# Patient Record
Sex: Female | Born: 1990 | Race: Black or African American | Hispanic: No | Marital: Single | Smoking: Current every day smoker
Health system: Southern US, Community
[De-identification: ages and names within clinical notes are randomized; demographics above are authoritative.]

## PROBLEM LIST (undated history)

## (undated) DIAGNOSIS — B999 Unspecified infectious disease: Secondary | ICD-10-CM

## (undated) DIAGNOSIS — F419 Anxiety disorder, unspecified: Secondary | ICD-10-CM

## (undated) DIAGNOSIS — R519 Headache, unspecified: Secondary | ICD-10-CM

## (undated) DIAGNOSIS — R87629 Unspecified abnormal cytological findings in specimens from vagina: Secondary | ICD-10-CM

## (undated) DIAGNOSIS — D352 Benign neoplasm of pituitary gland: Secondary | ICD-10-CM

## (undated) HISTORY — PX: NO PAST SURGERIES: SHX2092

---
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
## 2016-04-20 ENCOUNTER — Emergency Department (HOSPITAL_COMMUNITY)
Admission: EM | Admit: 2016-04-20 | Discharge: 2016-04-20 | Disposition: A | Discharge status: Home / Self Care | Payer: Medicaid Other | Attending: Emergency Medicine | Admitting: Emergency Medicine

## 2016-04-20 ENCOUNTER — Encounter (HOSPITAL_COMMUNITY): Payer: Self-pay | Admitting: Emergency Medicine

## 2016-04-20 DIAGNOSIS — F17200 Nicotine dependence, unspecified, uncomplicated: Secondary | ICD-10-CM | POA: Insufficient documentation

## 2016-04-20 DIAGNOSIS — H65191 Other acute nonsuppurative otitis media, right ear: Secondary | ICD-10-CM

## 2016-04-20 DIAGNOSIS — H9201 Otalgia, right ear: Secondary | ICD-10-CM | POA: Insufficient documentation

## 2016-04-20 DIAGNOSIS — J029 Acute pharyngitis, unspecified: Secondary | ICD-10-CM | POA: Insufficient documentation

## 2016-04-20 LAB — POC URINE PREG, ED: Preg Test, Ur: NEGATIVE

## 2016-04-20 NOTE — ED Provider Notes (Signed)
CSN: [id number]     Arrival date & time 04/20/16  0744 History   First MD Initiated Contact with Patient 04/20/16 [phone]     Chief Complaint  Patient presents with  . Otalgia    ear popping  . Sore Throat     (Consider location/radiation/quality/duration/timing/severity/associated sxs/prior Treatment) Patient is a 25 y.o. female presenting with general illness. The history is provided by the patient.  Illness Severity:  Mild Onset quality:  Gradual Duration:  1 week Timing:  Constant Progression:  Unchanged Chronicity:  New Associated symptoms: congestion and ear pain   Associated symptoms: no chest pain, no fever, no headaches, no myalgias, no nausea, no rhinorrhea, no shortness of breath, no vomiting and no wheezing     25 yo F with URI like symptoms.  Sore throat, congestion.  Denies fevers, chills.  Denies vomiting, cough.    Having some R ear popping with swallowing and chewing.   History reviewed. No pertinent past medical history. History reviewed. No pertinent past surgical history. No family history on file. Social History  Substance Use Topics  . Smoking status: Current Every Day Smoker  . Smokeless tobacco: None  . Alcohol Use: Yes   OB History    No data available     Review of Systems  Constitutional: Negative for fever and chills.  HENT: Positive for congestion and ear pain. Negative for rhinorrhea.   Eyes: Negative for redness and visual disturbance.  Respiratory: Negative for shortness of breath and wheezing.   Cardiovascular: Negative for chest pain and palpitations.  Gastrointestinal: Negative for nausea and vomiting.  Genitourinary: Negative for dysuria and urgency.  Musculoskeletal: Negative for myalgias and arthralgias.  Skin: Negative for pallor and wound.  Neurological: Negative for dizziness and headaches.      Allergies  Review of patient's allergies indicates not on file.  Home Medications   Prior to Admission medications   Not on  File   BP 110/72 mmHg  Pulse 85  Temp(Src) 97.8 F (36.6 C)  Resp 16  Ht 5\' 4"  (1.626 m)  Wt 174 lb (78.926 kg)  BMI 29.85 kg/m2  SpO2 100%  LMP 03/18/2016 Physical Exam  Constitutional: She is oriented to person, place, and time. She appears well-developed and well-nourished. No distress.  HENT:  Head: Normocephalic and atraumatic.  Swollen turbinates, posterior nasal drip, no noted sinus ttp, tm with small effusion bilaterally.  No erythema, no bulging.    Eyes: EOM are normal. Pupils are equal, round, and reactive to light.  Neck: Normal range of motion. Neck supple.  Cardiovascular: Normal rate and regular rhythm.  Exam reveals no gallop and no friction rub.   No murmur heard. Pulmonary/Chest: Effort normal. She has no wheezes. She has no rales.  Abdominal: Soft. She exhibits no distension. There is no tenderness.  Musculoskeletal: She exhibits no edema or tenderness.  Neurological: She is alert and oriented to person, place, and time.  Skin: Skin is warm and dry. She is not diaphoretic.  Psychiatric: She has a normal mood and affect. Her behavior is normal.  Nursing note and vitals reviewed.   ED Course  Procedures (including critical care time) Labs Review Labs Reviewed - No data to display  Imaging Review No results found. I have personally reviewed and evaluated these images and lab results as part of my medical decision-making.   EKG Interpretation None      MDM   Final diagnoses:  Acute middle ear effusion, right    25  yo F with uri like symptoms. No signs of otitis, posterior nasal drip.   8:05 AM:  I have discussed the diagnosis/risks/treatment options with the patient and believe the pt to be eligible for discharge home to follow-up with PCP. We also discussed returning to the ED immediately if new or worsening sx occur. We discussed the sx which are most concerning (e.g., sudden worsening pain, fever, inability to tolerate by mouth) that necessitate  immediate return. Medications administered to the patient during their visit and any new prescriptions provided to the patient are listed below.  Medications given during this visit Medications - No data to display  New Prescriptions   No medications on file    The patient appears reasonably screen and/or stabilized for discharge and I doubt any other medical condition or other [facility] requiring further screening, evaluation, or treatment in the ED at this time prior to discharge.       Deno Etienne, DO 04/20/16 [phone]

## 2016-04-20 NOTE — Discharge Instructions (Signed)
Try afrin and pseudofed.  Follow up with your family doc. If you do not have one call the hotline number provided.

## 2016-04-20 NOTE — ED Notes (Signed)
Pt. Stated, My ears have been popping since yesterday.

## 2016-04-26 ENCOUNTER — Encounter (HOSPITAL_COMMUNITY): Payer: Self-pay | Admitting: Emergency Medicine

## 2016-04-26 ENCOUNTER — Ambulatory Visit (HOSPITAL_COMMUNITY)
Admission: EM | Admit: 2016-04-26 | Discharge: 2016-04-26 | Disposition: A | Discharge status: Home / Self Care | Payer: Self-pay | Attending: Emergency Medicine | Admitting: Emergency Medicine

## 2016-04-26 DIAGNOSIS — H6500 Acute serous otitis media, unspecified ear: Secondary | ICD-10-CM

## 2016-04-26 DIAGNOSIS — H9201 Otalgia, right ear: Secondary | ICD-10-CM

## 2016-04-26 MED ORDER — NEOMYCIN-POLYMYXIN-HC 3.5-10000-1 OT SUSP: 4.0000 [drp] | Freq: Three times a day (TID) | OTIC | Status: DC

## 2016-04-26 MED ORDER — AMOXICILLIN 875 MG PO TABS: 875.0000 mg | ORAL_TABLET | Freq: Two times a day (BID) | ORAL | Status: DC

## 2016-04-26 NOTE — Discharge Instructions (Signed)
Ear Drops, Adult You have been diagnosed with a condition requiring you to put drops of medicine into your outer ear. HOME CARE INSTRUCTIONS   Put drops in the affected ear as instructed. After putting the drops in, you will need to lie down with the affected ear facing up for ten minutes so the drops will remain in the ear canal and run down and fill the canal. Continue using the ear drops for as long as directed by your health care provider.  Prior to getting up, put a cotton ball gently in your ear canal. Leave enough of the cotton ball out so it can be easily removed. Do not attempt to push this down into the canal with a cotton-tipped swab or other instrument.  Do not irrigate or wash out your ears if you have had a perforated eardrum or mastoid surgery, or unless instructed to do so by your health care provider.  Keep appointments with your health care provider as instructed.  Finish all medicine, or use for the length of time prescribed by your health care provider. Continue the drops even if your problem seems to be doing well after a couple days, or continue as instructed. SEEK MEDICAL CARE IF:  You become worse or develop increasing pain.  You notice any unusual drainage from your ear (particularly if the drainage has a bad smell).  You develop hearing difficulties.  You experience a serious form of dizziness in which you feel as if the room is spinning, and you feel nauseated (vertigo).  The outside of your ear becomes red or swollen or both. This may be a sign of an allergic reaction. MAKE SURE YOU:   Understand these instructions.  Will watch your condition.  Will get help right away if you are not doing well or get worse.   This information is not intended to replace advice given to you by your health care provider. Make sure you discuss any questions you have with your health care provider.   Document Released: 09/28/2001 Document Revised: 10/25/2014 Document  Reviewed: 05/01/2013 Elsevier Interactive Patient Education [employer].

## 2016-04-26 NOTE — ED Notes (Signed)
The patient presented to the [facility] with a complaint of a right ear ache that has been going on for 2 weeks. The patient was evaluated at the [facility] on 04/20/2016 and was advised to use OTC meds but she stated that she has not had the money to purchase those and her ear is hurting worse now.

## 2016-04-26 NOTE — ED Provider Notes (Signed)
CSN: [id number]     Arrival date & time 04/26/16  1605 History   First MD Initiated Contact with Patient 04/26/16 1650     Chief Complaint  Patient presents with  . Otalgia   (Consider location/radiation/quality/duration/timing/severity/associated sxs/prior Treatment) Patient is a 25 y.o. female presenting with ear pain. The history is provided by the patient.  Otalgia Location:  Right Behind ear:  No abnormality Quality:  Aching and dull Severity:  Mild Onset quality:  Gradual Duration:  2 weeks Timing:  Constant Progression:  Worsening Chronicity:  New Relieved by:  Nothing Worsened by:  Nothing tried Ineffective treatments:  None tried   History reviewed. No pertinent past medical history. History reviewed. No pertinent past surgical history. History reviewed. No pertinent family history. Social History  Substance Use Topics  . Smoking status: Current Every Day Smoker  . Smokeless tobacco: None  . Alcohol Use: Yes   OB History    No data available     Review of Systems  Constitutional: Negative.   HENT: Positive for ear pain.   Eyes: Negative.   Respiratory: Negative.   Cardiovascular: Negative.   Gastrointestinal: Negative.   Endocrine: Negative.   Genitourinary: Negative.   Musculoskeletal: Negative.   Skin: Negative.   Allergic/Immunologic: Negative.   Neurological: Negative.   Hematological: Negative.   Psychiatric/Behavioral: Negative.     Allergies  Review of patient's allergies indicates no known allergies.  Home Medications   Prior to Admission medications   Medication Sig Start Date End Date Taking? Authorizing Provider  bromocriptine (PARLODEL) 5 MG capsule Take 5 mg by mouth daily.   Yes Historical Provider, MD   Meds Ordered and Administered this Visit  Medications - No data to display  BP 118/69 mmHg  Pulse 91  Temp(Src) 98.6 F (37 C) (Oral)  Resp 18  SpO2 100%  LMP 04/04/2016 (Exact Date) No data found.   Physical Exam   Constitutional: She appears well-developed and well-nourished.  HENT:  Head: Normocephalic.  Left Ear: External ear normal.  Mouth/Throat: Oropharynx is clear and moist.  Right TM injected and dull  Eyes: Conjunctivae and EOM are normal. Pupils are equal, round, and reactive to light.  Neck: Normal range of motion. Neck supple.  Cardiovascular: Normal rate, regular rhythm and normal heart sounds.   Pulmonary/Chest: Effort normal and breath sounds normal.  Abdominal: Soft. Bowel sounds are normal.    ED Course  Procedures (including critical care time)  Labs Review Labs Reviewed - No data to display  Imaging Review No results found.   Visual Acuity Review  Right Eye Distance:   Left Eye Distance:   Bilateral Distance:    Right Eye Near:   Left Eye Near:    Bilateral Near:         MDM  Right otalgia Right otitis media  Amoxicillin 875mg  one po bid x 7 days #14 Cortisporin otic ear drops 4 gtt's right ear x 7 d #10 ml Sudafed otc prn      Lysbeth Penner, FNP 04/26/16 1711

## 2016-05-01 ENCOUNTER — Encounter (HOSPITAL_COMMUNITY): Payer: Self-pay

## 2016-05-01 ENCOUNTER — Emergency Department (HOSPITAL_COMMUNITY)
Admission: EM | Admit: 2016-05-01 | Discharge: 2016-05-01 | Disposition: A | Discharge status: Home / Self Care | Payer: Medicaid Other | Attending: Emergency Medicine | Admitting: Emergency Medicine

## 2016-05-01 DIAGNOSIS — F17200 Nicotine dependence, unspecified, uncomplicated: Secondary | ICD-10-CM | POA: Diagnosis not present

## 2016-05-01 DIAGNOSIS — H9201 Otalgia, right ear: Secondary | ICD-10-CM | POA: Diagnosis present

## 2016-05-01 DIAGNOSIS — H65191 Other acute nonsuppurative otitis media, right ear: Secondary | ICD-10-CM | POA: Diagnosis not present

## 2016-05-01 DIAGNOSIS — H6591 Unspecified nonsuppurative otitis media, right ear: Secondary | ICD-10-CM

## 2016-05-01 NOTE — Discharge Instructions (Signed)
Take your prescription of amoxicillin 875 mg twice daily for the next 7 days. I also recommend continuing to take a decongestant such as Sudafed as prescribed over-the-counter. Please follow up with a primary care provider from the Resource Guide provided below in 5-7 days if your symptoms have not improved. Please return to the Emergency Department if symptoms worsen or new onset of fever, headache, neck stiffness, facial/ear swelling, your drainage, loss of hearing, tenderness behind her right ear.

## 2016-05-01 NOTE — ED Notes (Signed)
Pt on cell phone during assessment. Female in room w/pt and small child.

## 2016-05-01 NOTE — ED Notes (Signed)
Patient here with right ear pain and decreased hearing x 3 weeks, states her ear feels full.

## 2016-05-01 NOTE — ED Provider Notes (Signed)
CSN: [id number]     Arrival date & time 05/01/16  1826 History  By signing my name below, I, Patricia Morales, attest that this documentation has been prepared under the direction and in the presence of non-physician practitioner, Harlene Ramus, PA-C. Electronically Signed: [name], Scribe. 05/01/2016. 7:09 PM.   Chief Complaint  Patient presents with  . Otalgia   The history is provided by the patient. No language interpreter was used.   HPI Comments: Patricia Morales is a 25 y.o. female who presents to the Emergency Department complaining of unchanged, right ear pressure and pain that began 3 weeks ago. Pt reports associated "fullness" in her ear, muffled hearing, and sinus pressure. Pt notes this is the third time she has visited the ED for similar symptoms. Pt states she was prescribed antibiotics at her last visit but was unable to fill her prescription because she could not afford it. She denies drainage from her left ear, nasal congestion, cough, fever, sore throat, and facial swelling. Pt denies hx of DM.    Chart review shows pt visited the ED on 7/4 in which she was diagnosed with a URI and treated with over the counter symptomatic management. Pt then visited [facility] on 7/10 in which she was diagnosed with otitis media and prescribed sudafed, amoxacillin, and cortisporin.   History reviewed. No pertinent past medical history. History reviewed. No pertinent past surgical history. No family history on file. Social History  Substance Use Topics  . Smoking status: Current Every Day Smoker  . Smokeless tobacco: None  . Alcohol Use: Yes   OB History    No data available     Review of Systems  Constitutional: Negative for fever.  HENT: Positive for congestion, ear pain and hearing loss. Negative for ear discharge, facial swelling and sore throat.   Respiratory: Negative for cough.    Allergies  Review of patient's allergies indicates no known allergies.  Home Medications   Prior to  Admission medications   Medication Sig Start Date End Date Taking? Authorizing Provider  amoxicillin (AMOXIL) 875 MG tablet Take 1 tablet (875 mg total) by mouth 2 (two) times daily. 04/26/16   Lysbeth Penner, FNP  bromocriptine (PARLODEL) 5 MG capsule Take 5 mg by mouth daily.    Historical Provider, MD  neomycin-polymyxin-hydrocortisone (CORTISPORIN) 3.5-10000-1 otic suspension Place 4 drops into the right ear 3 (three) times daily. 04/26/16   Lysbeth Penner, FNP   BP 108/69 mmHg  Pulse 95  Temp(Src) 98.3 F (36.8 C) (Oral)  Resp 18  SpO2 99%  LMP 04/04/2016 (Exact Date) Physical Exam  Constitutional: She is oriented to person, place, and time. She appears well-developed and well-nourished.  HENT:  Head: Normocephalic and atraumatic.  Right Ear: Hearing, external ear and ear canal normal. No drainage, swelling or tenderness. No mastoid tenderness. Tympanic membrane is injected. Tympanic membrane is not bulging. A middle ear effusion is present.  Left Ear: Hearing, external ear and ear canal normal. A middle ear effusion is present.  Nose: Nose normal.  Mouth/Throat: Uvula is midline, oropharynx is clear and moist and mucous membranes are normal. No oropharyngeal exudate, posterior oropharyngeal edema, posterior oropharyngeal erythema or tonsillar abscesses.  Eyes: Conjunctivae and EOM are normal. Right eye exhibits no discharge. Left eye exhibits no discharge. No scleral icterus.  Neck: Normal range of motion. Neck supple.  Cardiovascular: Normal rate.   Pulmonary/Chest: Effort normal.  Lymphadenopathy:    She has no cervical adenopathy.  Neurological: She is alert  and oriented to person, place, and time.  Nursing note and vitals reviewed.   ED Course  Procedures  DIAGNOSTIC STUDIES:  Oxygen Saturation is 99% on RA, normal by my interpretation.    COORDINATION OF CARE:  7:07 PM Discussed treatment Morales with pt at bedside and pt agreed to Morales.  Labs Review Labs Reviewed  - No data to display  Imaging Review No results found. I have personally reviewed and evaluated these images and lab results as part of my medical decision-making.   EKG Interpretation None      MDM   Final diagnoses:  Otitis media with effusion, right    Patient presents with otalgia and exam consistent with acute otitis media with bilateral effusion. No concern for acute mastoiditis, meningitis. No antibiotic use in the last month, patient denies feeling her prescription of amoxicillin which was given to her at her previous visit at urgent care 5 days ago.  Patient reports she can now afford her prescription and is planning on picking up her prescription this evening at [facility]. Advised patient to also continue taking over-the-counter decongestion as prescribed. Advised patient to follow up with PCP as needed. Discussed return precautions with patient.   I personally performed the services described in this documentation, which was scribed in my presence. The recorded information has been reviewed and is accurate.    Patricia Morales, [place] 05/01/16 1912  Patricia Ferguson, MD 05/01/16 [phone]

## 2016-05-01 NOTE — ED Notes (Signed)
PA at bedside.

## 2016-06-15 ENCOUNTER — Encounter (HOSPITAL_COMMUNITY): Payer: Self-pay | Admitting: *Deleted

## 2016-06-15 ENCOUNTER — Emergency Department (HOSPITAL_COMMUNITY)
Admission: EM | Admit: 2016-06-15 | Discharge: 2016-06-15 | Disposition: A | Discharge status: Home / Self Care | Payer: Medicaid Other | Attending: Emergency Medicine | Admitting: Emergency Medicine

## 2016-06-15 DIAGNOSIS — F17200 Nicotine dependence, unspecified, uncomplicated: Secondary | ICD-10-CM | POA: Insufficient documentation

## 2016-06-15 DIAGNOSIS — Z5321 Procedure and treatment not carried out due to patient leaving prior to being seen by health care provider: Secondary | ICD-10-CM | POA: Diagnosis not present

## 2016-06-15 DIAGNOSIS — R1010 Upper abdominal pain, unspecified: Secondary | ICD-10-CM | POA: Diagnosis not present

## 2016-06-15 HISTORY — DX: Benign neoplasm of pituitary gland: D35.2

## 2016-06-15 LAB — CBC
HCT: 32.6 % — ABNORMAL LOW (ref 36.0–46.0)
Hemoglobin: 11.1 g/dL — ABNORMAL LOW (ref 12.0–15.0)
MCH: 29.7 pg (ref 26.0–34.0)
MCHC: 34 g/dL (ref 30.0–36.0)
MCV: 87.2 fL (ref 78.0–100.0)
PLATELETS: 232 10*3/uL (ref 150–400)
RBC: 3.74 MIL/uL — ABNORMAL LOW (ref 3.87–5.11)
RDW: 12.7 % (ref 11.5–15.5)
WBC: 5.7 10*3/uL (ref 4.0–10.5)

## 2016-06-15 LAB — COMPREHENSIVE METABOLIC PANEL
ALT: 12 U/L — AB (ref 14–54)
AST: 15 U/L (ref 15–41)
Albumin: 3.9 g/dL (ref 3.5–5.0)
Alkaline Phosphatase: 49 U/L (ref 38–126)
Anion gap: 6 (ref 5–15)
BILIRUBIN TOTAL: 0.2 mg/dL — AB (ref 0.3–1.2)
BUN: 8 mg/dL (ref 6–20)
CO2: 25 mmol/L (ref 22–32)
CREATININE: 0.65 mg/dL (ref 0.44–1.00)
Calcium: 9.5 mg/dL (ref 8.9–10.3)
Chloride: 104 mmol/L (ref 101–111)
GFR calc Af Amer: >60 mL/min (ref >60)
Glucose, Bld: 95 mg/dL (ref 65–99)
POTASSIUM: 3.5 mmol/L (ref 3.5–5.1)
Sodium: 135 mmol/L (ref 135–145)
TOTAL PROTEIN: 7.3 g/dL (ref 6.5–8.1)

## 2016-06-15 LAB — I-STAT BETA HCG BLOOD, ED (MC, WL, AP ONLY)

## 2016-06-15 LAB — LIPASE, BLOOD: Lipase: 32 U/L (ref 11–51)

## 2016-06-15 NOTE — ED Notes (Signed)
Pt had to take her child to restroom.  This is the delay in getting back to triage.

## 2016-06-15 NOTE — ED Notes (Signed)
Pt came to desk advising she was leaving.  EMT Jeneen Rinks questioned why and she stated because her child needed to eat.  I advised he I would go get her child something to eat if she would stay.  Once I got ready to get up to go get her daughter a sandwich she stated " nevermind I am going to leave."  She walked out with family.

## 2016-06-15 NOTE — ED Triage Notes (Signed)
Pt states upper abdominal pain for 4 days and then rectal discomfort like she needs to poop.  Pt states she had a BM this am.  Last menstrual early July and thinks she is having pregnancy symptoms

## 2016-06-16 ENCOUNTER — Encounter (HOSPITAL_COMMUNITY): Payer: Self-pay | Admitting: Emergency Medicine

## 2016-06-16 ENCOUNTER — Emergency Department (HOSPITAL_COMMUNITY)
Admission: EM | Admit: 2016-06-16 | Discharge: 2016-06-17 | Disposition: A | Discharge status: Home / Self Care | Payer: Medicaid Other | Source: Home / Self Care | Attending: Emergency Medicine | Admitting: Emergency Medicine

## 2016-06-16 ENCOUNTER — Emergency Department (HOSPITAL_COMMUNITY): Payer: Medicaid Other

## 2016-06-16 DIAGNOSIS — M791 Myalgia: Secondary | ICD-10-CM | POA: Diagnosis not present

## 2016-06-16 DIAGNOSIS — F17210 Nicotine dependence, cigarettes, uncomplicated: Secondary | ICD-10-CM | POA: Diagnosis not present

## 2016-06-16 DIAGNOSIS — N390 Urinary tract infection, site not specified: Secondary | ICD-10-CM

## 2016-06-16 DIAGNOSIS — R1010 Upper abdominal pain, unspecified: Secondary | ICD-10-CM

## 2016-06-16 DIAGNOSIS — R109 Unspecified abdominal pain: Secondary | ICD-10-CM | POA: Diagnosis present

## 2016-06-16 DIAGNOSIS — R1011 Right upper quadrant pain: Secondary | ICD-10-CM | POA: Diagnosis not present

## 2016-06-16 DIAGNOSIS — F17200 Nicotine dependence, unspecified, uncomplicated: Secondary | ICD-10-CM | POA: Insufficient documentation

## 2016-06-16 LAB — COMPREHENSIVE METABOLIC PANEL
ALBUMIN: 4.1 g/dL (ref 3.5–5.0)
ALT: 12 U/L — ABNORMAL LOW (ref 14–54)
ANION GAP: 8 (ref 5–15)
AST: 17 U/L (ref 15–41)
Alkaline Phosphatase: 52 U/L (ref 38–126)
BILIRUBIN TOTAL: 0.4 mg/dL (ref 0.3–1.2)
BUN: 9 mg/dL (ref 6–20)
CO2: 25 mmol/L (ref 22–32)
Calcium: 9.8 mg/dL (ref 8.9–10.3)
Chloride: 103 mmol/L (ref 101–111)
Creatinine, Ser: 0.62 mg/dL (ref 0.44–1.00)
GFR calc Af Amer: >60 mL/min (ref >60)
Glucose, Bld: 108 mg/dL — ABNORMAL HIGH (ref 65–99)
POTASSIUM: 3.6 mmol/L (ref 3.5–5.1)
Sodium: 136 mmol/L (ref 135–145)
TOTAL PROTEIN: 7.3 g/dL (ref 6.5–8.1)

## 2016-06-16 LAB — URINALYSIS, ROUTINE W REFLEX MICROSCOPIC
Bilirubin Urine: NEGATIVE
Glucose, UA: NEGATIVE mg/dL
Ketones, ur: NEGATIVE mg/dL
Nitrite: NEGATIVE
Protein, ur: NEGATIVE mg/dL
Specific Gravity, Urine: 1.02 (ref 1.005–1.030)
pH: 6 (ref 5.0–8.0)

## 2016-06-16 LAB — CBC
HEMATOCRIT: 31.7 % — AB (ref 36.0–46.0)
HEMOGLOBIN: 11 g/dL — AB (ref 12.0–15.0)
MCH: 30.2 pg (ref 26.0–34.0)
MCHC: 34.7 g/dL (ref 30.0–36.0)
MCV: 87.1 fL (ref 78.0–100.0)
Platelets: 220 10*3/uL (ref 150–400)
RBC: 3.64 MIL/uL — AB (ref 3.87–5.11)
RDW: 12.6 % (ref 11.5–15.5)
WBC: 5.7 10*3/uL (ref 4.0–10.5)

## 2016-06-16 LAB — URINE MICROSCOPIC-ADD ON

## 2016-06-16 LAB — LIPASE, BLOOD: Lipase: 28 U/L (ref 11–51)

## 2016-06-16 LAB — POC URINE PREG, ED: Preg Test, Ur: NEGATIVE

## 2016-06-16 MED ORDER — FOSFOMYCIN TROMETHAMINE 3 G PO PACK
3.0000 g | PACK | Freq: Once | ORAL | Status: AC
  Administered 2016-06-17: 3 g via ORAL
  Filled 2016-06-16: qty 3

## 2016-06-16 MED ORDER — ACETAMINOPHEN 325 MG PO TABS
650.0000 mg | ORAL_TABLET | Freq: Once | ORAL | Status: AC
  Administered 2016-06-17: 650 mg via ORAL
  Filled 2016-06-16: qty 2

## 2016-06-16 NOTE — ED Triage Notes (Signed)
Abdominal pain for 4-5 days. Feels tight and constipated. Had small BMs of only small, hard balls of stool. Feels nauseated and is also concerned she may be pregnant (has not taken a pregnancy test at home).

## 2016-06-16 NOTE — Discharge Instructions (Signed)
As discussed, your evaluation today has been largely reassuring.  But, it is important that you monitor your condition carefully, and do not hesitate to return to the ED if you develop new, or concerning changes in your condition. ? ?Otherwise, please follow-up with your physician for appropriate ongoing care. ? ?

## 2016-06-16 NOTE — ED Provider Notes (Signed)
[facility] DEPT Provider Note   CSN: [id number] Arrival date & time: 06/16/16  2032     History   Chief Complaint Chief Complaint  Patient presents with  . Abdominal Pain    HPI Patricia Morales is a 25 y.o. female.  HPI Patient presents with concern of abdominal pain. Symptoms of been present for at least 4 days, with migratory abdominal pain, currently on the right lateral abdomen, though yesterday it was in the midline upper abdomen. Mild associated nausea, and some pain in the area with duration. No vomiting, no diarrhea, and the patient actually acknowledges constipation. Patient recently moved to this area, has no primary care physician. Last menstrual period slightly more than one month ago. 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 Patient also takes no medication for hormone balance. Since onset of this pain episode, no medication taken for pain relief. Patient also denies other new changes, including fevers, chills, urinary complaints, Past Medical History:  Diagnosis Date  . Prolactinoma ([place])     There are no active problems to display for this patient.   History reviewed. No pertinent surgical history.  OB History    No data available       Home Medications    Prior to Admission medications   Not on File    Family History History reviewed. No pertinent family history.  Social History Social History  Substance Use Topics  . Smoking status: Current Every Day Smoker    Packs/day: 0.50  . Smokeless tobacco: Never Used  . Alcohol use Yes     Allergies   Review of patient's allergies indicates no known allergies.   Review of Systems Review of Systems  Constitutional:       Per HPI, otherwise negative  HENT:       Per HPI, otherwise negative  Respiratory:       Per HPI, otherwise negative  Cardiovascular:       Per HPI, otherwise negative  Gastrointestinal: Negative for vomiting.  Endocrine:       Negative aside  from HPI  Genitourinary:       Neg aside from HPI   Musculoskeletal:       Per HPI, otherwise negative  Skin: Negative.   Neurological: Negative for syncope.     Physical Exam Updated Vital Signs BP 103/68 (BP Location: Left Arm)   Pulse 80   Temp 98.5 F (36.9 C) (Oral)   Resp 19   LMP 04/19/2016 (Within Months) Comment: double-shielded patient/ pt. states she is not preg.  SpO2 99%   Physical Exam  Constitutional: She is oriented to person, place, and time. She appears well-developed and well-nourished. No distress.  HENT:  Head: Normocephalic and atraumatic.  Eyes: Conjunctivae and EOM are normal.  Cardiovascular: Normal rate and regular rhythm.   Pulmonary/Chest: Effort normal and breath sounds normal. No stridor. No respiratory distress.  Abdominal: She exhibits no distension.  Minimal discomfort with palpation about the right lateral abdominal wall, negative Murphy,  Musculoskeletal: She exhibits no edema.  Neurological: She is alert and oriented to person, place, and time. No cranial nerve deficit.  Skin: Skin is warm and dry.  Psychiatric: She has a normal mood and affect.  Nursing note and vitals reviewed.    ED Treatments / Results  Labs (all labs ordered are listed, but only abnormal results are displayed) Labs Reviewed  COMPREHENSIVE METABOLIC PANEL - Abnormal; Notable for the following:       Result Value  Glucose, Bld 108 (*)    ALT 12 (*)    All other components within normal limits  CBC - Abnormal; Notable for the following:    RBC 3.64 (*)    Hemoglobin 11.0 (*)    HCT 31.7 (*)    All other components within normal limits  URINALYSIS, ROUTINE W REFLEX MICROSCOPIC (NOT AT [facility]) - Abnormal; Notable for the following:    APPearance CLOUDY (*)    Hgb urine dipstick MODERATE (*)    Leukocytes, UA SMALL (*)    All other components within normal limits  URINE MICROSCOPIC-ADD ON - Abnormal; Notable for the following:    Squamous Epithelial / LPF 6-30  (*)    Bacteria, UA MANY (*)    All other components within normal limits  URINE CULTURE  LIPASE, BLOOD  POC URINE PREG, ED     Radiology Dg Chest 2 View  Result Date: 06/16/2016 CLINICAL DATA:  Dyspnea with inspiration EXAM: CHEST  2 VIEW COMPARISON:  None. FINDINGS: Cardiomediastinal contours are normal. No pneumothorax or sizable pleural effusion. No focal airspace consolidation or pulmonary edema. IMPRESSION: Clear lungs. Electronically Signed   By: Ulyses Jarred M.D.   On: 06/16/2016 22:02    Procedures Procedures (including critical care time)  Medications Ordered in ED Medications  fosfomycin (MONUROL) packet 3 g (not administered)  acetaminophen (TYLENOL) tablet 650 mg (not administered)     Initial Impression / Assessment and Plan / ED Course  I have reviewed the triage vital signs and the nursing notes.  Pertinent labs & imaging results that were available during my care of the patient were reviewed by me and considered in my medical decision making (see chart for details). In female presents with several days of abdominal pain. Here the patient is awake and alert, with soft, minimally tender abdomen. No evidence for peritonitis, bacteremia, sepsis. There is some evidence for possible urinary tract infection, and the patient had urine culture sent after initial antibiotics provided. Patient provided outpatient follow-up resources.   Carmin Muskrat, MD 06/16/16 2312

## 2016-06-17 ENCOUNTER — Inpatient Hospital Stay (HOSPITAL_COMMUNITY): Payer: Medicaid Other

## 2016-06-17 ENCOUNTER — Inpatient Hospital Stay (HOSPITAL_COMMUNITY)
Admission: AD | Admit: 2016-06-17 | Discharge: 2016-06-17 | Disposition: A | Discharge status: Home / Self Care | Payer: Medicaid Other | Source: Ambulatory Visit | Attending: Obstetrics & Gynecology | Admitting: Obstetrics & Gynecology

## 2016-06-17 DIAGNOSIS — R109 Unspecified abdominal pain: Secondary | ICD-10-CM | POA: Insufficient documentation

## 2016-06-17 DIAGNOSIS — M791 Myalgia: Secondary | ICD-10-CM

## 2016-06-17 DIAGNOSIS — M7918 Myalgia, other site: Secondary | ICD-10-CM

## 2016-06-17 DIAGNOSIS — F17210 Nicotine dependence, cigarettes, uncomplicated: Secondary | ICD-10-CM | POA: Insufficient documentation

## 2016-06-17 DIAGNOSIS — R1011 Right upper quadrant pain: Secondary | ICD-10-CM

## 2016-06-17 LAB — URINE MICROSCOPIC-ADD ON

## 2016-06-17 LAB — URINALYSIS, ROUTINE W REFLEX MICROSCOPIC
Bilirubin Urine: NEGATIVE
Glucose, UA: NEGATIVE mg/dL
KETONES UR: NEGATIVE mg/dL
LEUKOCYTES UA: NEGATIVE
NITRITE: NEGATIVE
PROTEIN: NEGATIVE mg/dL
Specific Gravity, Urine: 1.015 (ref 1.005–1.030)
pH: 7 (ref 5.0–8.0)

## 2016-06-17 MED ORDER — IBUPROFEN 800 MG PO TABS: 800.0000 mg | ORAL_TABLET | Freq: Three times a day (TID) | ORAL | 3 refills | Status: DC | PRN

## 2016-06-17 MED ORDER — CYCLOBENZAPRINE HCL 10 MG PO TABS: 10.0000 mg | ORAL_TABLET | Freq: Three times a day (TID) | ORAL | 2 refills | Status: DC | PRN

## 2016-06-17 NOTE — MAU Provider Note (Signed)
History     CSN: [id number]  Arrival date and time: 06/17/16 1748   None     Chief Complaint  Patient presents with  . Abdominal Pain   25 y.o. F here with report of right sided upper abdominal pain for the past week. This is her third visit to ER; she left AMA before evaluation on 06/15/16, underwent extensive evaluation on 06/16/16 and the only diagnosis was of a possible UTI.  She denies she has a UTI, is concerned about the pain on the top-right corner of her abdomen. She is able to eat, had a pork chop dinner a few hours ago. She does endorse doing some heavy lifting, feels she may have gotten a muscle spasm but she is worried about having gallstones. No exacerbation when eating, no other associated symptoms.   The history is provided by the patient.  Abdominal Pain  The primary symptoms of the illness include abdominal pain and vaginal bleeding. The primary symptoms of the illness do not include fever, fatigue, nausea, vomiting or vaginal discharge. The problem has not changed since onset. The abdominal pain is located in the RUQ. The abdominal pain does not radiate.  The patient states that she believes she is currently not pregnant. The patient has not had a change in bowel habit. Symptoms associated with the illness do not include chills, anorexia, heartburn, urgency, frequency or back pain.    Past Medical History:  Diagnosis Date  . Prolactinoma ([place])     No past surgical history on file.  No family history on file.  Social History  Substance Use Topics  . Smoking status: Current Every Day Smoker    Packs/day: 0.50  . Smokeless tobacco: Never Used  . Alcohol use Yes    Allergies: No Known Allergies  No prescriptions prior to admission.    Review of Systems  Constitutional: Negative for chills, fatigue and fever.  Gastrointestinal: Positive for abdominal pain. Negative for anorexia, heartburn, nausea and vomiting.  Genitourinary: Positive for vaginal bleeding.  Negative for frequency, urgency and vaginal discharge.  Musculoskeletal: Negative for back pain.   Physical Exam   Blood pressure 121/65, pulse 86, temperature 98.7 F (37.1 C), temperature source Oral, resp. rate 18, last menstrual period 04/19/2016.  Physical Exam  Constitutional: She is oriented to person, place, and time. She appears well-developed and well-nourished.  HENT:  Head: Normocephalic and atraumatic.  Cardiovascular: Normal rate.   Respiratory: Effort normal.  GI: Soft. Bowel sounds are normal. She exhibits no distension. There is tenderness. There is no rebound and no guarding.  RUQ/R flank tenderness to palpation. Negative Murphy's sign.  Musculoskeletal: Normal range of motion.  Neurological: She is alert and oriented to person, place, and time.  Skin: Skin is warm and dry.  Psychiatric: She has a normal mood and affect.    MAU Course  Procedures  MDM RUQ Ultrasound was ordered; was suboptimal given patient's fasting status (ate 2 hours before study). No gall stones were seen  Results for orders placed or performed during the hospital encounter of 06/17/16 (from the past 72 hour(s))  Urinalysis, Routine w reflex microscopic (not at [facility])     Status: Abnormal   Collection Time: 06/17/16  6:57 PM  Result Value Ref Range   Color, Urine YELLOW YELLOW   APPearance CLEAR CLEAR   Specific Gravity, Urine 1.015 1.005 - 1.030   pH 7.0 5.0 - 8.0   Glucose, UA NEGATIVE NEGATIVE mg/dL   Hgb urine dipstick SMALL (A)  NEGATIVE   Bilirubin Urine NEGATIVE NEGATIVE   Ketones, ur NEGATIVE NEGATIVE mg/dL   Protein, ur NEGATIVE NEGATIVE mg/dL   Nitrite NEGATIVE NEGATIVE   Leukocytes, UA NEGATIVE NEGATIVE  Urine microscopic-add on     Status: Abnormal   Collection Time: 06/17/16  6:57 PM  Result Value Ref Range   Squamous Epithelial / LPF 0-5 (A) NONE SEEN   WBC, UA 0-5 0 - 5 WBC/hpf   RBC / HPF 0-5 0 - 5 RBC/hpf   Bacteria, UA FEW (A) NONE SEEN   CMP Latest Ref Rng &  Units 06/16/2016 06/15/2016  Glucose 65 - 99 mg/dL 108(H) 95  BUN 6 - 20 mg/dL 9 8  Creatinine 0.44 - 1.00 mg/dL 0.62 0.65  Sodium 135 - 145 mmol/L 136 135  Potassium 3.5 - 5.1 mmol/L 3.6 3.5  Chloride 101 - 111 mmol/L 103 104  CO2 22 - 32 mmol/L 25 25  Calcium 8.9 - 10.3 mg/dL 9.8 9.5  Total Protein 6.5 - 8.1 g/dL 7.3 7.3  Total Bilirubin 0.3 - 1.2 mg/dL 0.4 0.2(L)  Alkaline Phos 38 - 126 U/L 52 49  AST 15 - 41 U/L 17 15  ALT 14 - 54 U/L 12(L) 12(L)     Assessment and Plan  RUQ/R flank pain. Negative RUQ U/S, negative CMET. Likely musculoskeletal. Flexeril and Ibuprofen prescribed Advised to return for worsening symptoms.  Osborne Oman, MD 06/17/2016, 8:31 PM

## 2016-06-17 NOTE — MAU Note (Signed)
Pt seen in ER yesterday, dx'd with UTI, but pt states she doesn't have a UTI.  C/O sharp pain in R side, radiates into upper abdomen for the past week.  Denies dysuria.  Denies bleeding or discharge.

## 2016-06-17 NOTE — Discharge Instructions (Signed)
[place] for [employer] at [facility]      Phone: [phone]  [facility] for [employer] at [place] Phone: [place] for [employer] at [place]  Phone: [place] for [place] at [facility]  Phone: [place] for [place] at [place]  Phone: [place] [facility]       Phone: [phone]   [place]   Phone: [phone]  [facility]      Phone: [phone]      Flank Pain Flank pain refers to pain that is located on the side of the body between the upper abdomen and the back. The pain may occur over a short period of time (acute) or may be long-term or reoccurring (chronic). It may be mild or severe. Flank pain can be caused by many things. CAUSES  Some of the more common causes of flank pain include:  Muscle strains.   Muscle spasms.   A disease of your spine (vertebral disk disease).   A lung infection (pneumonia).   Fluid around your lungs (pulmonary edema).   A kidney infection.   Kidney stones.   A very painful skin rash caused by the chickenpox virus (shingles).   Gallbladder disease.  [place] care will depend on the cause of your pain. In general,  Rest as directed by your caregiver.  Drink enough fluids to keep your urine clear or pale yellow.  Only take over-the-counter or prescription medicines as directed by your caregiver. Some medicines may help relieve the pain.  Tell your caregiver about any changes in your pain.  Follow up with your caregiver as directed. SEEK IMMEDIATE MEDICAL CARE IF:   Your pain is not controlled with medicine.   You have new or worsening symptoms.  Your pain increases.   You have abdominal pain.   You have shortness of breath.   You have persistent nausea or vomiting.   You  have swelling in your abdomen.   You feel faint or pass out.   You have blood in your urine.  You have a fever or persistent symptoms for more than 2-3 days.  You have a fever and your symptoms suddenly get worse. MAKE SURE YOU:   Understand these instructions.  Will watch your condition.  Will get help right away if you are not doing well or get worse.   This information is not intended to replace advice given to you by your health care provider. Make sure you discuss any questions you have with your health care provider.   Document Released: 11/25/2005 Document Revised: 06/28/2012 Document Reviewed: 05/18/2012 Elsevier Interactive Patient Education [employer].

## 2016-06-18 LAB — URINE CULTURE

## 2016-07-13 ENCOUNTER — Ambulatory Visit: Payer: Self-pay | Admitting: Family Medicine

## 2017-01-17 ENCOUNTER — Encounter (HOSPITAL_COMMUNITY): Payer: Self-pay

## 2017-01-17 ENCOUNTER — Emergency Department (HOSPITAL_COMMUNITY)
Admission: EM | Admit: 2017-01-17 | Discharge: 2017-01-17 | Disposition: A | Discharge status: Home / Self Care | Payer: Medicaid Other | Attending: Emergency Medicine | Admitting: Emergency Medicine

## 2017-01-17 DIAGNOSIS — N939 Abnormal uterine and vaginal bleeding, unspecified: Secondary | ICD-10-CM | POA: Insufficient documentation

## 2017-01-17 DIAGNOSIS — D352 Benign neoplasm of pituitary gland: Secondary | ICD-10-CM

## 2017-01-17 DIAGNOSIS — Z79899 Other long term (current) drug therapy: Secondary | ICD-10-CM | POA: Insufficient documentation

## 2017-01-17 DIAGNOSIS — F17200 Nicotine dependence, unspecified, uncomplicated: Secondary | ICD-10-CM | POA: Insufficient documentation

## 2017-01-17 LAB — I-STAT BETA HCG BLOOD, ED (MC, WL, AP ONLY)

## 2017-01-17 NOTE — Discharge Instructions (Signed)
You are given resources for primary care providers who can give you referral to endocrinology. Please also try endocrinology through [facility] regarding follow-up.

## 2017-01-17 NOTE — ED Provider Notes (Signed)
[place] DEPT Provider Note    By signing my name below, I, Bea Graff, attest that this documentation has been prepared under the direction and in the presence of Forde Dandy, MD. Electronically Signed: Bea Graff, ED Scribe. 01/17/17. 6:38 PM.    History   Chief Complaint Chief Complaint  Patient presents with  . Vaginal Bleeding    The history is provided by the patient and medical records. No language interpreter was used.    Patricia Morales is a 26 y.o. female who presents to the Emergency Department complaining of vaginal bleeding she noticed after she wiped after urinating this morning. She states she has not been taking her Bromocriptine prescribed by her endocrinologist in [place]  For prolactinoma because it makes her feel sick. Pt states she is currently wearing a pad and has not seen any blood on it at all. She has not done anything to treat her symptoms. There are no modifying factors noted. She denies vaginal discharge, abdominal pain, or any other complaints. She does not currently have a PCP and has tried to establish care with an endocrinologist at [facility] in [facility]. She states she is having difficulty having her medical records transferred.    Past Medical History:  Diagnosis Date  . Prolactinoma ([place])     There are no active problems to display for this patient.   History reviewed. No pertinent surgical history.  OB History    No data available       Home Medications    Prior to Admission medications   Medication Sig Start Date End Date Taking? Authorizing Provider  cyclobenzaprine (FLEXERIL) 10 MG tablet Take 1 tablet (10 mg total) by mouth 3 (three) times daily as needed for muscle spasms. 06/17/16   Osborne Oman, MD  ibuprofen (ADVIL,MOTRIN) 800 MG tablet Take 1 tablet (800 mg total) by mouth 3 (three) times daily with meals as needed for headache or moderate pain. 06/17/16   Osborne Oman, MD    Family  History History reviewed. No pertinent family history.  Social History Social History  Substance Use Topics  . Smoking status: Current Every Day Smoker    Packs/day: 0.50  . Smokeless tobacco: Never Used  . Alcohol use Yes     Allergies   Patient has no known allergies.   Review of Systems Review of Systems 10/14 systems reviewed and are negative other than those stated in the HPI   Physical Exam Updated Vital Signs BP 100/67 (BP Location: Left Arm)   Pulse 89   Temp 98.9 F (37.2 C) (Oral)   Resp 19   SpO2 97%   Physical Exam Physical Exam  Nursing note and vitals reviewed. Constitutional: Well developed, well nourished, non-toxic, and in no acute distress Head: Normocephalic and atraumatic.  Mouth/Throat: Oropharynx is clear and moist.  Neck: Normal range of motion. Neck supple.  Cardiovascular: Normal rate and regular rhythm.   Pulmonary/Chest: Effort normal and breath sounds normal.  Abdominal: Soft. There is no tenderness. There is no rebound and no guarding.  Musculoskeletal: Normal range of motion.  Neurological: Alert, no facial droop, fluent speech, moves all extremities symmetrically Skin: Skin is warm and dry.  Psychiatric: Cooperative   ED Treatments / Results  DIAGNOSTIC STUDIES: Oxygen Saturation is 97% on RA, normal by my interpretation.   COORDINATION OF CARE: 6:36 PM- Will give resources for pt to establish care with a PCP. Pt verbalizes understanding and agrees to plan.  Medications - No data  to display  Labs (all labs ordered are listed, but only abnormal results are displayed) Labs Reviewed  I-STAT BETA HCG BLOOD, ED ([facility], [facility], [facility] ONLY)    EKG  EKG Interpretation None       Radiology No results found.  Procedures Procedures (including critical care time)  Medications Ordered in ED Medications - No data to display   Initial Impression / Assessment and Plan / ED Course  I have reviewed the triage vital signs and the  nursing notes.  Pertinent labs & imaging results that were available during my care of the patient were reviewed by me and considered in my medical decision making (see chart for details).     History of the prolactinoma, with episode of vaginal spotting today in the setting of not taking her bromocriptine. She has been amenorrheic for the past 2 months. This is likely because of her prolactinoma and not taking her medications. She is trying to reestablish care with endocrinology here, and given resources for primary care provider as well as endocrinology also that she can restart these medications. States that her vaginal spotting is now gone. Has otherwise benign exam. No concerns for STD/PID or other intrapelvic processes.   The patient appears reasonably screened and/or stabilized for discharge and I doubt any other medical condition or other [facility] requiring further screening, evaluation, or treatment in the ED at this time prior to discharge.  Strict return and follow-up instructions reviewed. She expressed understanding of all discharge instructions and felt comfortable with the plan of care.   Final Clinical Impressions(s) / ED Diagnoses   Final diagnoses:  Vaginal spotting  Prolactinoma ([place])    New Prescriptions New Prescriptions   No medications on file   I personally performed the services described in this documentation, which was scribed in my presence. The recorded information has been reviewed and is accurate.      Forde Dandy, MD 01/17/17 [phone]

## 2017-01-17 NOTE — ED Triage Notes (Signed)
Pt states she has not had a period in 60 days. This morning she noticed bright red blood. Pt states this was abnormal for her.

## 2017-06-01 ENCOUNTER — Emergency Department (HOSPITAL_COMMUNITY)
Admission: EM | Admit: 2017-06-01 | Discharge: 2017-06-01 | Disposition: A | Discharge status: Home / Self Care | Payer: Medicaid Other | Attending: Emergency Medicine | Admitting: Emergency Medicine

## 2017-06-01 ENCOUNTER — Encounter (HOSPITAL_COMMUNITY): Payer: Self-pay

## 2017-06-01 DIAGNOSIS — R51 Headache: Secondary | ICD-10-CM | POA: Insufficient documentation

## 2017-06-01 DIAGNOSIS — Z5321 Procedure and treatment not carried out due to patient leaving prior to being seen by health care provider: Secondary | ICD-10-CM | POA: Insufficient documentation

## 2017-06-01 LAB — URINALYSIS, ROUTINE W REFLEX MICROSCOPIC
Bilirubin Urine: NEGATIVE
GLUCOSE, UA: NEGATIVE mg/dL
Ketones, ur: NEGATIVE mg/dL
LEUKOCYTES UA: NEGATIVE
Nitrite: NEGATIVE
PH: 8 (ref 5.0–8.0)
Protein, ur: NEGATIVE mg/dL
RBC / HPF: NONE SEEN RBC/hpf (ref 0–5)
SPECIFIC GRAVITY, URINE: 1.008 (ref 1.005–1.030)

## 2017-06-01 LAB — COMPREHENSIVE METABOLIC PANEL
ALT: 14 U/L (ref 14–54)
AST: 18 U/L (ref 15–41)
Albumin: 4 g/dL (ref 3.5–5.0)
Alkaline Phosphatase: 42 U/L (ref 38–126)
Anion gap: 9 (ref 5–15)
BUN: 5 mg/dL — AB (ref 6–20)
CHLORIDE: 103 mmol/L (ref 101–111)
CO2: 27 mmol/L (ref 22–32)
CREATININE: 0.66 mg/dL (ref 0.44–1.00)
Calcium: 10 mg/dL (ref 8.9–10.3)
GFR calc Af Amer: >60 mL/min (ref >60)
Glucose, Bld: 102 mg/dL — ABNORMAL HIGH (ref 65–99)
Potassium: 3.5 mmol/L (ref 3.5–5.1)
Sodium: 139 mmol/L (ref 135–145)
Total Bilirubin: 0.6 mg/dL (ref 0.3–1.2)
Total Protein: 7.6 g/dL (ref 6.5–8.1)

## 2017-06-01 LAB — CBC
HCT: 33 % — ABNORMAL LOW (ref 36.0–46.0)
Hemoglobin: 11.1 g/dL — ABNORMAL LOW (ref 12.0–15.0)
MCH: 28.6 pg (ref 26.0–34.0)
MCHC: 33.6 g/dL (ref 30.0–36.0)
MCV: 85.1 fL (ref 78.0–100.0)
PLATELETS: 252 10*3/uL (ref 150–400)
RBC: 3.88 MIL/uL (ref 3.87–5.11)
RDW: 13.8 % (ref 11.5–15.5)
WBC: 5.9 10*3/uL (ref 4.0–10.5)

## 2017-06-01 LAB — I-STAT BETA HCG BLOOD, ED (MC, WL, AP ONLY): I-stat hCG, quantitative: <5 m[IU]/mL (ref <5)

## 2017-06-01 LAB — LIPASE, BLOOD: LIPASE: 27 U/L (ref 11–51)

## 2017-06-01 NOTE — ED Triage Notes (Signed)
Pt reports lack of period for four months, sts that it is related to her prolactin being elecated reports breast leakage and abd cramps, sts she does not think it is pregnancy related, pt sts headache also related to her tumor. Prolactinoma hx.

## 2017-06-01 NOTE — ED Notes (Signed)
Called for room X2, no answer

## 2017-07-05 ENCOUNTER — Encounter (HOSPITAL_COMMUNITY): Payer: Self-pay | Admitting: *Deleted

## 2017-07-05 ENCOUNTER — Inpatient Hospital Stay (HOSPITAL_COMMUNITY)
Admission: AD | Admit: 2017-07-05 | Discharge: 2017-07-05 | Disposition: A | Discharge status: Home / Self Care | Payer: Self-pay | Source: Ambulatory Visit | Attending: Family Medicine | Admitting: Family Medicine

## 2017-07-05 DIAGNOSIS — B373 Candidiasis of vulva and vagina: Secondary | ICD-10-CM | POA: Insufficient documentation

## 2017-07-05 DIAGNOSIS — Z8489 Family history of other specified conditions: Secondary | ICD-10-CM | POA: Insufficient documentation

## 2017-07-05 DIAGNOSIS — F17210 Nicotine dependence, cigarettes, uncomplicated: Secondary | ICD-10-CM | POA: Insufficient documentation

## 2017-07-05 DIAGNOSIS — N760 Acute vaginitis: Secondary | ICD-10-CM

## 2017-07-05 DIAGNOSIS — B3731 Acute candidiasis of vulva and vagina: Secondary | ICD-10-CM | POA: Diagnosis present

## 2017-07-05 DIAGNOSIS — B9689 Other specified bacterial agents as the cause of diseases classified elsewhere: Secondary | ICD-10-CM | POA: Insufficient documentation

## 2017-07-05 DIAGNOSIS — Z833 Family history of diabetes mellitus: Secondary | ICD-10-CM | POA: Insufficient documentation

## 2017-07-05 DIAGNOSIS — Z809 Family history of malignant neoplasm, unspecified: Secondary | ICD-10-CM | POA: Insufficient documentation

## 2017-07-05 LAB — CBC
HEMATOCRIT: 34.3 % — AB (ref 36.0–46.0)
Hemoglobin: 11.9 g/dL — ABNORMAL LOW (ref 12.0–15.0)
MCH: 29.8 pg (ref 26.0–34.0)
MCHC: 34.7 g/dL (ref 30.0–36.0)
MCV: 86 fL (ref 78.0–100.0)
PLATELETS: 234 10*3/uL (ref 150–400)
RBC: 3.99 MIL/uL (ref 3.87–5.11)
RDW: 14 % (ref 11.5–15.5)
WBC: 6.7 10*3/uL (ref 4.0–10.5)

## 2017-07-05 LAB — URINALYSIS, ROUTINE W REFLEX MICROSCOPIC
BILIRUBIN URINE: NEGATIVE
GLUCOSE, UA: NEGATIVE mg/dL
KETONES UR: NEGATIVE mg/dL
Nitrite: NEGATIVE
PH: 6 (ref 5.0–8.0)
Protein, ur: NEGATIVE mg/dL
SPECIFIC GRAVITY, URINE: 1.012 (ref 1.005–1.030)

## 2017-07-05 LAB — WET PREP, GENITAL
Sperm: NONE SEEN
TRICH WET PREP: NONE SEEN

## 2017-07-05 LAB — POCT PREGNANCY, URINE: Preg Test, Ur: NEGATIVE

## 2017-07-05 MED ORDER — METRONIDAZOLE 500 MG PO TABS: 500.0000 mg | ORAL_TABLET | Freq: Two times a day (BID) | ORAL | 0 refills | Status: AC

## 2017-07-05 MED ORDER — FLUCONAZOLE 150 MG PO TABS: 150.0000 mg | ORAL_TABLET | Freq: Every day | ORAL | 1 refills | Status: DC

## 2017-07-05 NOTE — MAU Note (Signed)
Pt reports her sig other told her that he had sex with someone else and that person had HSV. Pt reports she has been having vaginal irritation.

## 2017-07-05 NOTE — Discharge Instructions (Signed)
[place] [facility]  ° ° °[facility]       Phone: [phone] ° °[facility] Phone: [phone] ° °[facility] [place]  Phone: [phone] ° °[facility] [place]  Phone: [phone] ° °[facility] [place]  Phone: [phone] ° °[facility]       Phone: [phone] ° °[facility]    Phone: [phone]  ° °[facility]    Phone: [phone] ° °[facility]    Phone: [phone] ° °[facility]    Phone: [phone] ° °[facility]    Phone: [phone] ° °[facility]       Phone: [phone]  ° °[facility]  Phone: [phone] ° °[facility]    Phone: [phone] ° °[facility]   Phone: [phone] ° °[facility]      Phone: [phone] ° °[facility]    Phone: [phone] ° °

## 2017-07-05 NOTE — MAU Provider Note (Signed)
History     CSN: [id number]  Arrival date and time: 07/05/17 1251   First Provider Initiated Contact with Patient 07/05/17 1425      Chief Complaint  Patient presents with  . Vaginal Itching   HPI  Ms. Patricia Morales is a 26 y.o. G1P1001 non-pregnant female presenting to MAU with complaints of vaginal irritation.  Her significant other recently had SI with someone else who reportedly has HSV.  She is here today to have her vaginal irritation evaluated and STI testing.  She is unsure of pregnancy; although she recently had a menstrual cycle.  Past Medical History:  Diagnosis Date  . Prolactinoma [facility])     Past Surgical History:  Procedure Laterality Date  . NO PAST SURGERIES      Family History  Problem Relation Age of Onset  . Vision loss Mother   . Hearing loss Maternal Uncle   . Diabetes Maternal Grandmother   . Cancer Paternal Grandmother     Social History  Substance Use Topics  . Smoking status: Current Every Day Smoker    Packs/day: 0.50  . Smokeless tobacco: Never Used  . Alcohol use Yes    Allergies: No Known Allergies  No prescriptions prior to admission.    Review of Systems  Constitutional: Negative.   HENT: Negative.   Eyes: Negative.   Respiratory: Negative.   Cardiovascular: Negative.   Gastrointestinal: Negative.   Endocrine: Negative.   Genitourinary: Positive for vaginal discharge and vaginal pain (irritation).  Musculoskeletal: Negative.   Skin: Negative.   Allergic/Immunologic: Negative.   Neurological: Negative.   Psychiatric/Behavioral: Negative.    Physical Exam   Blood pressure 117/76, pulse 85, temperature 98.5 F (36.9 C), temperature source Oral, resp. rate 16, height 5\' 4"  (1.626 m), weight 87.1 kg (192 lb), last menstrual period 06/21/2017, SpO2 100 %.  Physical Exam  Constitutional: She is oriented to person, place, and time. She appears well-developed and well-nourished.  HENT:  Head: Normocephalic.  Eyes: Pupils  are equal, round, and reactive to light.  Neck: Normal range of motion.  Cardiovascular: Normal rate, regular rhythm and normal heart sounds.   Respiratory: Effort normal and breath sounds normal.  GI: Soft. Bowel sounds are normal.  Genitourinary:  Genitourinary Comments: Uterus: non-tender, cx: smooth, pink, no lesions, small amt of thin, white vaginal d/c, closed/long/firm, no CMT or friability, no adnexal tenderness   Musculoskeletal: Normal range of motion.  Neurological: She is alert and oriented to person, place, and time.  Skin: Skin is warm and dry.  Psychiatric: She has a normal mood and affect. Her behavior is normal. Judgment and thought content normal.    MAU Course  Procedures  MDM CCUA UPT Wet Prep GC/CT HIV RPR CBC  Results for orders placed or performed during the hospital encounter of 07/05/17 (from the past 24 hour(s))  Urinalysis, Routine w reflex microscopic     Status: Abnormal   Collection Time: 07/05/17  1:00 PM  Result Value Ref Range   Color, Urine YELLOW YELLOW   APPearance HAZY (A) CLEAR   Specific Gravity, Urine 1.012 1.005 - 1.030   pH 6.0 5.0 - 8.0   Glucose, UA NEGATIVE NEGATIVE mg/dL   Hgb urine dipstick MODERATE (A) NEGATIVE   Bilirubin Urine NEGATIVE NEGATIVE   Ketones, ur NEGATIVE NEGATIVE mg/dL   Protein, ur NEGATIVE NEGATIVE mg/dL   Nitrite NEGATIVE NEGATIVE   Leukocytes, UA LARGE (A) NEGATIVE   RBC / HPF 0-5 0 - 5 RBC/hpf  WBC, UA 6-30 0 - 5 WBC/hpf   Bacteria, UA RARE (A) NONE SEEN   Squamous Epithelial / LPF 6-30 (A) NONE SEEN   Budding Yeast PRESENT   Pregnancy, urine POC     Status: None   Collection Time: 07/05/17  1:19 PM  Result Value Ref Range   Preg Test, Ur NEGATIVE NEGATIVE  Wet prep, genital     Status: Abnormal   Collection Time: 07/05/17  2:43 PM  Result Value Ref Range   Yeast Wet Prep HPF POC PRESENT (A) NONE SEEN   Trich, Wet Prep NONE SEEN NONE SEEN   Clue Cells Wet Prep HPF POC PRESENT (A) NONE SEEN    WBC, Wet Prep HPF POC MODERATE (A) NONE SEEN   Sperm NONE SEEN   CBC     Status: Abnormal   Collection Time: 07/05/17  3:10 PM  Result Value Ref Range   WBC 6.7 4.0 - 10.5 K/uL   RBC 3.99 3.87 - 5.11 MIL/uL   Hemoglobin 11.9 (L) 12.0 - 15.0 g/dL   HCT 34.3 (L) 36.0 - 46.0 %   MCV 86.0 78.0 - 100.0 fL   MCH 29.8 26.0 - 34.0 pg   MCHC 34.7 30.0 - 36.0 g/dL   RDW 14.0 11.5 - 15.5 %   Platelets 234 150 - 400 K/uL    Assessment and Plan  Bacterial vaginitis - Rx for Flagyl 500 mg BID x 7 days - Information on BV provided  Candida vaginitis - Rx for Diflucan 150 mg po x 1 dose - Information on Candida vagnitis provided - F/U with GYN provider of your choice - return to MAU for OB or GYN emergencies  Discharge home Patient verbalized an understanding of the plan of care and agrees.   Laury Deep, MSN, CNM 07/05/2017, 2:25 PM

## 2017-07-05 NOTE — MAU Provider Note (Signed)
History   Patricia Morales is a 26 y/o female [place] who presents today for evaluation of vaginal irritation and itching. Patient reports that significant other recently had sex with another woman who reportedly has herpes. Patient now having vaginal itching.discomfort. She denies any vaginal pain, abnormal discharge, or malodor. 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 She does not use contraception and is not sure if she is pregnant. Patient requesting to be tested for STI's.   CSN: [id number]  Arrival date and time: 07/05/17 1251   First Provider Initiated Contact with Patient 07/05/17 1425      Chief Complaint  Patient presents with  . Vaginal Itching   HPI  OB History    Gravida Para Term Preterm AB Living   1 1 1     1    SAB TAB Ectopic Multiple Live Births                  Past Medical History:  Diagnosis Date  . Prolactinoma [facility])     Past Surgical History:  Procedure Laterality Date  . NO PAST SURGERIES      Family History  Problem Relation Age of Onset  . Vision loss Mother   . Hearing loss Maternal Uncle   . Diabetes Maternal Grandmother   . Cancer Paternal Grandmother     Social History  Substance Use Topics  . Smoking status: Current Every Day Smoker    Packs/day: 0.50  . Smokeless tobacco: Never Used  . Alcohol use Yes    Allergies: No Known Allergies  No prescriptions prior to admission.    Review of Systems  Constitutional: Positive for fatigue. Negative for fever.  Gastrointestinal: Positive for abdominal pain and nausea. Negative for constipation, diarrhea and vomiting.       Admits to abdominal pain which feels similar to heart burn, not having pain currently   Genitourinary: Negative for difficulty urinating, dysuria, genital sores, pelvic pain, vaginal bleeding, vaginal discharge and vaginal pain.  Skin: Negative for color change.   Physical Exam   Blood pressure 117/76, pulse 85, temperature 98.5 F  (36.9 C), temperature source Oral, resp. rate 16, height 5\' 4"  (1.626 m), weight 87.1 kg (192 lb), last menstrual period 06/21/2017, SpO2 100 %.  Physical Exam  Constitutional: She appears well-developed and well-nourished. No distress.  Obese   HENT:  Head: Normocephalic and atraumatic.  Eyes: Pupils are equal, round, and reactive to light.  GI: She exhibits no distension and no mass. There is no tenderness. There is no rebound and no guarding.  Genitourinary: There is no rash, tenderness, lesion or injury on the right labia. There is no rash, tenderness, lesion or injury on the left labia. Cervix exhibits discharge. Cervix exhibits no motion tenderness and no friability. Right adnexum displays no mass and no tenderness. Left adnexum displays no mass and no tenderness. No erythema, tenderness or bleeding in the vagina. No signs of injury around the vagina. No vaginal discharge found.  Skin: Skin is warm and dry.  Psychiatric: She has a normal mood and affect.    MAU Course  Procedures  MDM Results for orders placed or performed during the hospital encounter of 07/05/17 (from the past 24 hour(s))  Urinalysis, Routine w reflex microscopic     Status: Abnormal   Collection Time: 07/05/17  1:00 PM  Result Value Ref Range   Color, Urine YELLOW YELLOW   APPearance HAZY (A) CLEAR   Specific Gravity, Urine 1.012  1.005 - 1.030   pH 6.0 5.0 - 8.0   Glucose, UA NEGATIVE NEGATIVE mg/dL   Hgb urine dipstick MODERATE (A) NEGATIVE   Bilirubin Urine NEGATIVE NEGATIVE   Ketones, ur NEGATIVE NEGATIVE mg/dL   Protein, ur NEGATIVE NEGATIVE mg/dL   Nitrite NEGATIVE NEGATIVE   Leukocytes, UA LARGE (A) NEGATIVE   RBC / HPF 0-5 0 - 5 RBC/hpf   WBC, UA 6-30 0 - 5 WBC/hpf   Bacteria, UA RARE (A) NONE SEEN   Squamous Epithelial / LPF 6-30 (A) NONE SEEN   Budding Yeast PRESENT   Pregnancy, urine POC     Status: None   Collection Time: 07/05/17  1:19 PM  Result Value Ref Range   Preg Test, Ur NEGATIVE  NEGATIVE  Wet prep, genital     Status: Abnormal   Collection Time: 07/05/17  2:43 PM  Result Value Ref Range   Yeast Wet Prep HPF POC PRESENT (A) NONE SEEN   Trich, Wet Prep NONE SEEN NONE SEEN   Clue Cells Wet Prep HPF POC PRESENT (A) NONE SEEN   WBC, Wet Prep HPF POC MODERATE (A) NONE SEEN   Sperm NONE SEEN      Assessment and Plan  1. Bacterial vaginosis  Plan: Rx for metronidazole 500 mg PO BID x 7 days Return as needed   2. Candidal Vaginitis  Plan: Rx for fluconazole 150 mg PO Return as needed   Tommie Sams Deavon Podgorski 07/05/2017, 2:58 PM

## 2017-07-05 NOTE — Progress Notes (Addendum)
Non pregnant. Here due vaginal itching and irritations. States SO had intercourse with someone who has HSV.   UPT negative  wetprep GC and pelvic done  1410: discharge instructions given with pt understanding. Pt knows to pickup med at [facility]. Pt left unit with toddler daughter via ambulatory

## 2017-07-06 LAB — GC/CHLAMYDIA PROBE AMP (~~LOC~~) NOT AT ARMC
Chlamydia: NEGATIVE
Neisseria Gonorrhea: NEGATIVE

## 2017-07-06 LAB — HIV ANTIBODY (ROUTINE TESTING W REFLEX): HIV Screen 4th Generation wRfx: NONREACTIVE

## 2017-07-06 LAB — RPR: RPR Ser Ql: NONREACTIVE

## 2018-07-31 ENCOUNTER — Encounter (HOSPITAL_COMMUNITY): Payer: Self-pay | Admitting: *Deleted

## 2018-08-10 ENCOUNTER — Ambulatory Visit (HOSPITAL_COMMUNITY)
Admission: RE | Admit: 2018-08-10 | Discharge: 2018-08-10 | Disposition: A | Discharge status: Home / Self Care | Payer: Self-pay | Source: Ambulatory Visit | Attending: Obstetrics and Gynecology | Admitting: Obstetrics and Gynecology

## 2018-08-10 ENCOUNTER — Encounter (HOSPITAL_COMMUNITY): Payer: Self-pay | Admitting: *Deleted

## 2018-08-10 VITALS — BP 118/80 | Ht 64.0 in

## 2018-08-10 DIAGNOSIS — R87613 High grade squamous intraepithelial lesion on cytologic smear of cervix (HGSIL): Secondary | ICD-10-CM

## 2018-08-10 DIAGNOSIS — Z1239 Encounter for other screening for malignant neoplasm of breast: Secondary | ICD-10-CM

## 2018-08-10 NOTE — Patient Instructions (Signed)
Explained breast self awareness with [place] [facility]. Patient did not need a Pap smear today due to last Pap smear was 07/14/2018. Explained the colposcopy to patient the recommended follow-up for her abnormal Pap smear. Referred patient to the [facility] for [place] [facility] for a colposcopy to follow-up for her abnormal Pap smear. Appointment scheduled for Tuesday, August 22, 2018 at 1355. Patient aware of appointment and will be there. Let patient know a screening mammogram is recommended at age 27 unless clinically indicated prior. Brigett Estell verbalized understanding.  Ariannah Arenson, Arvil Chaco, RN 1:47 PM

## 2018-08-10 NOTE — Progress Notes (Signed)
Patient referred to [facility] by [facility] due to having an abnormal Pap smear 07/14/2018 that a colposcopy is recommended for follow-up. Patient has bilateral milky discharge that she is being followed by her PCP. Patient is currently on medication and has a follow-up appointment scheduled for tomorrow.  Pap Smear: Pap smear not completed today. Last Pap smear was 07/14/2018 at [facility] and HS[facility]. Referred patient to the [facility] for [place] at [facility] for a colposcopy to follow-up for her abnormal Pap smear. Appointment scheduled for Tuesday, August 22, 2018 at 1355. Per patient has a history of an abnormal Pap smear 4 years ago that no follow-up was completed due to she was pregnant. Last Pap smear result is in Epic.  Physical exam: Breasts Breasts symmetrical. No skin abnormalities bilateral breasts. No nipple retraction bilateral breasts. Bilateral milky discharge expressed from breasts on exam. Patient stated she has elevated prolactin levels and is being follow-up by her PCP. Patient stated she is currently on medication and has a follow-up appointment scheduled for tomorrow. No lymphadenopathy. No lumps palpated bilateral breasts. No complaints of pain or tenderness on exam. Screening mammogram recommended at age 65 unless clinically indicated prior.       Pelvic/Bimanual No Pap smear completed today since last Pap smear was 07/14/2018. Pap smear not indicated per BCCCP guidelines.   Smoking History: Patient has never smoked.  Patient Navigation: Patient education provided. Access to services provided for patient through BCCCP program.   Breast and Cervical Cancer Risk Assessment: Patient has no family history of breast cancer, known genetic mutations, or radiation treatment to the chest before age 44. Patient has no history of cervical dysplasia, immunocompromised, or DES exposure in-utero. Breast Cancer risk assessment  complete. No risk calculated due to patient is less than 37 years old.

## 2018-08-21 ENCOUNTER — Encounter (HOSPITAL_COMMUNITY): Payer: Self-pay | Admitting: *Deleted

## 2018-08-22 ENCOUNTER — Other Ambulatory Visit (HOSPITAL_COMMUNITY)
Admission: RE | Admit: 2018-08-22 | Discharge: 2018-08-22 | Disposition: A | Discharge status: Home / Self Care | Payer: Medicaid Other | Source: Ambulatory Visit | Attending: Family Medicine | Admitting: Family Medicine

## 2018-08-22 ENCOUNTER — Ambulatory Visit (INDEPENDENT_AMBULATORY_CARE_PROVIDER_SITE_OTHER): Payer: Self-pay | Admitting: Family Medicine

## 2018-08-22 ENCOUNTER — Encounter: Payer: Self-pay | Admitting: Family Medicine

## 2018-08-22 VITALS — BP 108/63 | HR 83 | Wt 196.4 lb

## 2018-08-22 DIAGNOSIS — Z3202 Encounter for pregnancy test, result negative: Secondary | ICD-10-CM

## 2018-08-22 DIAGNOSIS — R87810 Cervical high risk human papillomavirus (HPV) DNA test positive: Secondary | ICD-10-CM | POA: Insufficient documentation

## 2018-08-22 DIAGNOSIS — R87613 High grade squamous intraepithelial lesion on cytologic smear of cervix (HGSIL): Secondary | ICD-10-CM

## 2018-08-22 DIAGNOSIS — D069 Carcinoma in situ of cervix, unspecified: Secondary | ICD-10-CM | POA: Insufficient documentation

## 2018-08-22 LAB — POCT PREGNANCY, URINE: Preg Test, Ur: NEGATIVE

## 2018-08-22 NOTE — Progress Notes (Signed)
   Subjective:    Patricia Morales - 27 y.o. female MRN [id number]  Date of birth: 1990/12/26  HPI  Patricia Morales is a 27 y.o. G92P1001 female here for colposcopy. Pap 07/14/18 showed HSIL positive HRHPV. She states that she has never had an abnormal pap before. She smokes cigarettes, but is trying to quit. All other questions answered prior to procedure.      OB History    Gravida  1   Para  1   Term  1   Preterm      AB      Living  1     SAB      TAB      Ectopic      Multiple      Live Births                Health Maintenance:  HSIL pap and positive HRHPV on 07/13/18.   Health Maintenance Due  Topic Date Due  . HIV Screening  03/25/2004  . TETANUS/TDAP  03/25/2008  . PAP SMEAR  03/25/2010  . INFLUENZA VACCINE  05/18/2018    -  reports that she has been smoking. She has been smoking about 0.50 packs per day. She has never used smokeless tobacco. - Review of Systems: Per HPI. - Past Medical History: Patient Active Problem List   Diagnosis Date Noted  . 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 Pap smear of cervix shows high risk HPV present 08/22/2018  . Bacterial vaginitis 07/05/2017  . Candida vaginitis 07/05/2017   - Medications: reviewed and updated   Objective:   Physical Exam BP 108/63   Pulse 83   Wt 196 lb 6.4 oz (89.1 kg)   BMI 33.71 kg/m  Gen: NAD, alert, cooperative with exam, well-appearing HEENT: NCAT, PERRL, clear conjunctiva, oropharynx clear, supple neck CV: RRR Resp: Non-labored Skin: no rashes, normal turgor  Neuro: no gross deficits.  Psych: good insight, alert and oriented GU/GYN: Exam performed in the presence of a chaperone. External genitalia within normal limits.  Vaginal mucosa pink, moist, normal rugae.  Nonfriable cervix without lesions, no discharge or bleeding noted on speculum exam.  No cervical motion tenderness. No adnexal masses bilaterally.     Assessment & Plan:   1. Negative  pregnancy test 2. HGSIL (high grade squamous intraepithelial lesion) on Pap smear of cervix Colposcopy Procedure Note: Consent and a timeout were performed prior to starting the procedure.  The patient was placed in dorsal lithotomy position.  The perineum, vulva and vagina were normal.  Speculum introduced and cervix identified.  The colposcope was advanced.  Acetic acid applied x 1 minute to the cervix. [place] changes identified at 3, 6 and 9 o'clock. Biopsies taken. ECC performed. Hemostasis assured with pressure and monsels. The patient tolerated the procedure well. Return precautions discussed.  - Surgical pathology( [facility]/ [facility])   Routine preventative health maintenance measures emphasized. Please refer to After Visit Summary for other counseling recommendations.   Return if symptoms worsen or fail to improve.  Aura Camps, MD OB Fellow  08/22/2018, 6:59 PM

## 2018-08-23 ENCOUNTER — Encounter: Payer: Self-pay | Admitting: *Deleted

## 2018-09-21 ENCOUNTER — Encounter: Payer: Self-pay | Admitting: Obstetrics and Gynecology

## 2018-09-21 ENCOUNTER — Ambulatory Visit: Payer: Medicaid Other | Admitting: Obstetrics and Gynecology

## 2018-09-21 VITALS — BP 119/78 | HR 82 | Wt 197.0 lb

## 2018-09-21 DIAGNOSIS — D069 Carcinoma in situ of cervix, unspecified: Secondary | ICD-10-CM

## 2018-09-21 NOTE — Progress Notes (Signed)
Obstetrics and Gynecology Visit Return Patient Evaluation  Appointment Date: 09/21/2018  Primary Care Provider: Patient, No Pcp Per  OBGYN Clinic: [facility]  Chief Complaint: f/u colpo  History of Present Illness:  Patricia Morales is a 27 y.o. G1P1 (LMP: 11/27) with the above CC. Patient has the following cervical dysplasia history: 07/14/18: HSIL, HPV+ 11/5: colpo (note doesn't say if adequate or not). ECC negative but scant tissue. 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 Patient also states she had abnormal paps prior to that pregnancy but didn't follow up. +tobacco abuse.   She was set up for an appointment today by her colpo provider to go over options  Review of Systems: as noted in the History of Present Illness. Medications:  Patricia Morales had no medications administered during this visit. Current Outpatient Medications  Medication Sig Dispense Refill  . cabergoline (DOSTINEX) 0.5 MG tablet Take 0.5 mg by mouth 2 (two) times a week.     No current facility-administered medications for this visit.     Allergies: has No Known Allergies.  Physical Exam:  BP 119/78   Pulse 82   Wt 197 lb (89.4 kg)   BMI 33.81 kg/m  Body mass index is 33.81 kg/m. General appearance: Well nourished, well developed female in no acute distress.  Neuro/Psych:  Normal mood and affect.    Plan: D/w her re: potential OB risks with LEEP (PTL, PTB, SAB) but given her persistently abnormal paps and lack of follow up I recommend doing a LEEP; pt unable to do it today. I told her the seriousness of not doing it and I told her that if she doesn't get it done, she'll likely end up with cx cancer sometime in the next five years. I told her that after a LEEP she'll still need regular paps q6-90m to make  sure all abnormal cells were removed. I also encouraged her to stop smoking. Pt not on contraception and no sex since LMP and I told her to please abstain until after the procedure. Pt amenable to plan.  RTC: 1-2wks for LEEP  Durene Romans MD Attending Center for [facility])

## 2018-09-25 ENCOUNTER — Telehealth (HOSPITAL_COMMUNITY): Payer: Self-pay | Admitting: *Deleted

## 2018-09-25 NOTE — Telephone Encounter (Signed)
Telephoned patient at home number and advised patient was calling to schedule appointment to fill out [facility] Medicaid paperwork. Patient states was unsure of getting LEEP procedure. Advised patient if left untreated could lead to cervical cancer. Patient states was aware of this and would try natural ways to treat. Advised patient to speak with provider about this decision. Also, advised patient if paperwork was not filled out and decided to get LEEP procedure then she would be responsible for the bill. Patient voiced understanding.

## 2018-10-12 ENCOUNTER — Encounter: Payer: Self-pay | Admitting: Obstetrics and Gynecology

## 2018-10-12 ENCOUNTER — Ambulatory Visit: Payer: Medicaid Other | Admitting: Obstetrics and Gynecology

## 2018-10-13 NOTE — Progress Notes (Unsigned)
Patient did not keep her LEEP appointment for 10/12/2018. CMA asked to send certified letter to patient.   Durene Romans MD Attending Center for [facility] [employer] [facility])

## 2018-10-23 ENCOUNTER — Inpatient Hospital Stay (HOSPITAL_COMMUNITY)
Admission: AD | Admit: 2018-10-23 | Discharge: 2018-10-23 | Disposition: A | Discharge status: Home / Self Care | Payer: Medicaid Other | Attending: Family Medicine | Admitting: Family Medicine

## 2018-10-23 ENCOUNTER — Encounter (HOSPITAL_COMMUNITY): Payer: Self-pay | Admitting: *Deleted

## 2018-10-23 ENCOUNTER — Other Ambulatory Visit: Payer: Self-pay

## 2018-10-23 DIAGNOSIS — F17210 Nicotine dependence, cigarettes, uncomplicated: Secondary | ICD-10-CM | POA: Insufficient documentation

## 2018-10-23 DIAGNOSIS — R1031 Right lower quadrant pain: Secondary | ICD-10-CM | POA: Insufficient documentation

## 2018-10-23 DIAGNOSIS — N898 Other specified noninflammatory disorders of vagina: Secondary | ICD-10-CM

## 2018-10-23 DIAGNOSIS — A599 Trichomoniasis, unspecified: Secondary | ICD-10-CM

## 2018-10-23 DIAGNOSIS — A5901 Trichomonal vulvovaginitis: Secondary | ICD-10-CM | POA: Insufficient documentation

## 2018-10-23 LAB — URINALYSIS, ROUTINE W REFLEX MICROSCOPIC
Bacteria, UA: NONE SEEN
Bilirubin Urine: NEGATIVE
Glucose, UA: NEGATIVE mg/dL
KETONES UR: NEGATIVE mg/dL
Nitrite: NEGATIVE
Protein, ur: NEGATIVE mg/dL
Specific Gravity, Urine: 1.014 (ref 1.005–1.030)
pH: 9 — ABNORMAL HIGH (ref 5.0–8.0)

## 2018-10-23 LAB — WET PREP, GENITAL
Clue Cells Wet Prep HPF POC: NONE SEEN
Sperm: NONE SEEN
Yeast Wet Prep HPF POC: NONE SEEN

## 2018-10-23 LAB — POCT PREGNANCY, URINE: Preg Test, Ur: NEGATIVE

## 2018-10-23 MED ORDER — METRONIDAZOLE 500 MG PO TABS
2000.0000 mg | ORAL_TABLET | Freq: Once | ORAL | Status: AC
  Administered 2018-10-23: 2000 mg via ORAL
  Filled 2018-10-23: qty 4

## 2018-10-23 NOTE — MAU Note (Signed)
Vaginal itching and d/c.  Denies pain or bleeding.

## 2018-10-23 NOTE — Discharge Instructions (Signed)
Trichomoniasis Trichomoniasis is an STI (sexually transmitted infection) that can affect both women and men. In women, the outer area of the female genitalia (vulva) and the vagina are affected. In men, the penis is mainly affected, but the prostate and other reproductive organs can also be involved. This condition can be treated with medicine. It often has no symptoms (is asymptomatic), especially in men. What are the causes? This condition is caused by an organism called Trichomonas vaginalis. Trichomoniasis most often spreads from person to person (is contagious) through sexual contact. What increases the risk? The following factors may make you more likely to develop this condition:  Having unprotected sexual intercourse.  Having sexual intercourse with a partner who has trichomoniasis.  Having multiple sexual partners.  Having had previous trichomoniasis infections or other STIs. What are the signs or symptoms? In women, symptoms of trichomoniasis include:  Abnormal vaginal discharge that is clear, white, gray, or yellow-green and foamy and has an unusual "fishy" odor.  Itching and irritation of the vagina and vulva.  Burning or pain during urination or sexual intercourse.  Genital redness and swelling. In men, symptoms of trichomoniasis include:  Penile discharge that may be foamy or contain pus.  Pain in the penis. This may happen only when urinating.  Itching or irritation inside the penis.  Burning after urination or ejaculation. How is this diagnosed? In women, this condition may be found during a routine Pap test or physical exam. It may be found in men during a routine physical exam. Your health care provider may perform tests to help diagnose this infection, such as:  Urine tests (men and women).  The following in women: ? Testing the pH of the vagina. ? A vaginal swab test that checks for the Trichomonas vaginalis organism. ? Testing vaginal secretions. Your  health care provider may test you for other STIs, including HIV (human immunodeficiency virus). How is this treated? This condition is treated with medicine taken by mouth (orally), such as metronidazole or tinidazole to fight the infection. Your sexual partner(s) may also need to be tested and treated.  If you are a woman and you plan to become pregnant or think you may be pregnant, tell your health care provider right away. Some medicines that are used to treat the infection should not be taken during pregnancy. Your health care provider may recommend over-the-counter medicines or creams to help relieve itching or irritation. You may be tested for infection again 3 months after treatment. Follow these instructions at home:  Take and use over-the-counter and prescription medicines, including creams, only as told by your health care provider.  Do not have sexual intercourse until one week after you finish your medicine, or until your health care provider approves. Ask your health care provider when you may resume sexual intercourse.  (Women) Do not douche or wear tampons while you have the infection.  Discuss your infection with your sexual partner(s). Make sure that your partner gets tested and treated, if necessary.  Keep all follow-up visits as told by your health care provider. This is important. How is this prevented?  Use condoms every time you have sex. Using condoms correctly and consistently can help protect against STIs.  Avoid having multiple sexual partners.  Talk with your sexual partner about any symptoms that either of you may have, as well as any history of STIs.  Get tested for STIs and STDs (sexually transmitted diseases) before you have sex. Ask your partner to do the same.    Do not have sexual contact if you have symptoms of trichomoniasis or another STI. Contact a health care provider if:  You still have symptoms after you finish your medicine.  You develop pain in  your abdomen.  You have pain when you urinate.  You have bleeding after sexual intercourse.  You develop a rash.  You feel nauseous or you vomit.  You plan to become pregnant or think you may be pregnant. Summary  Trichomoniasis is an STI (sexually transmitted infection) that can affect both women and men.  This condition often has no symptoms (is asymptomatic), especially in men.  You should not have sexual intercourse until one week after you finish your medicine, or until your health care provider approves. Ask your health care provider when you may resume sexual intercourse.  Discuss your infection with your sexual partner. Make sure that your partner gets tested and treated, if necessary. This information is not intended to replace advice given to you by your health care provider. Make sure you discuss any questions you have with your health care provider. Document Released: 03/30/2001 Document Revised: 08/27/2016 Document Reviewed: 08/27/2016 Elsevier Interactive Patient Education  2019 Elsevier Inc.  

## 2018-10-23 NOTE — MAU Provider Note (Addendum)
  History     CSN: [id number]  Arrival date and time: 10/23/18 1118   First Provider Initiated Contact with Patient 10/23/18 1144      Chief Complaint  Patient presents with  . Vaginal Discharge  . Vaginal Itching   HPI Pt is a 28 year old female with a 3 day history of watery grey vaginal discharge with associated itchiness.  Symptoms began after having unprotected intercourse with her female partner. Pt also complains of intermittent RLQ pain, which she attributes to gas. She denies vaginal pain and bleeding.   Pertinent Gynecological History: Menses: last menses 10/12/18 Bleeding: none Contraception: none  History of yeast infection several years ago.  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 Patient has elected not to proceed with LEEP.    Past Medical History:  Diagnosis Date  . 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 NO PAST SURGERIES      Family History  Problem Relation Age of Onset  . Vision loss Mother   . Hearing loss Maternal Uncle   . Diabetes Maternal Grandmother   . Cancer Paternal Grandmother     Social History   Tobacco Use  . Smoking status: Current Every Day Smoker    Packs/day: 0.50  . Smokeless tobacco: Never Used  Substance Use Topics  . Alcohol use: Not Currently  . Drug use: No    Allergies: No Known Allergies  Medications Prior to Admission  Medication Sig Dispense Refill Last Dose  . cabergoline (DOSTINEX) 0.5 MG tablet Take 0.5 mg by mouth 2 (two) times a week.   More than a month at Unknown time    Review of Systems  Gen: denies headaches HEENT: (+) rhinorrhea Cardio: denies CP and palpitations Pulm: denies SOB Abd: (+) intermittent RLQ tenderness for several months GU: (+) vaginal itchiness, grey, watery discharge; denies vaginal pain or bleeding  Physical Exam   Blood pressure 120/75, pulse 79, temperature 98.3 F (36.8 C),  temperature source Oral, resp. rate 15, height 5\' 4"  (1.626 m), weight 85 kg, last menstrual period 10/12/2018, SpO2 99 %.  Physical Exam  Gen: Appears well-developed, well-nourished, slightly fatigued HEENT: Eyes PERRL, EOMI.  Cardio: RRR, no m/r/g Pulm: Lungs clear in all fields bilaterally Abd: non-distended non-tender, bowel sounds present in all four quadrants GU: normal cervix without redness or bleeding, white-grey thick vaginal discharge noted, no CMT or adnexal tenderness MSK: Moves all extremities normally. No deformities.  Neuro: Normal tone. No gross deficits.  Skin: Warm and dry.  Psych: Normal mood and affect.   MAU Course   MDM -POCT Urine Pregnancy Test -GC probe -Trichomonas wet prep   Assessment and Plan   1. Vaginal discharge   2. Trichimoniasis    Positive trichomonas wet prep. No concern for PID given stable VS and no CMT on exam.  - Single dose, 2g Metronidazole PO today GC probe - Results pending, follow up with phone call Discussed safe sex practices and encouraged to have partner tested/treated prior to sexual intercourse.   Rosalyn Gess 10/23/2018, 12:25 PM   OB FELLOW MAU DISCHARGE ATTESTATION  I have seen and examined this patient; I agree with above documentation in the student's note and have edited as appropriate.    Phill Myron, D.O. OB Fellow  10/23/2018, 4:06 PM

## 2018-10-24 LAB — GC/CHLAMYDIA PROBE AMP (~~LOC~~) NOT AT ARMC
Chlamydia: NEGATIVE
Neisseria Gonorrhea: NEGATIVE

## 2019-01-23 ENCOUNTER — Encounter: Payer: Self-pay | Admitting: *Deleted

## 2019-04-04 ENCOUNTER — Other Ambulatory Visit: Payer: Self-pay | Admitting: Family Medicine

## 2019-04-18 ENCOUNTER — Other Ambulatory Visit: Payer: Self-pay

## 2019-04-18 ENCOUNTER — Ambulatory Visit: Payer: Medicaid Other | Admitting: Internal Medicine

## 2019-04-18 NOTE — Progress Notes (Deleted)
Name: Patricia Morales  MRN/ DOB: [id number], [birth year]    Age/ Sex: 28 y.o., female    PCP: Patient, No Pcp Per   Reason for Endocrinology Evaluation:      Date of Initial Endocrinology Evaluation: 04/18/2019     HPI: Ms. Patricia Morales is a 28 y.o. female with a past medical history of Prolactinoma, Abnormal pap and hx of drug abuse. The patient presented for initial endocrinology clinic visit on 04/18/2019 for consultative assistance with her prolactinoma   [facility] in [facility]/2019 with irregular periods . She was diagnosed with prlactinoma years ago,  while living in [place]. She has not had any follow up in the past 2-3 yrs.   PCP unable to obtain records from her endocrinologist in [place].   She has been on cabergoline 0.5 mg twice weekly      HISTORY:  Past Medical History:  Past Medical History:  Diagnosis Date  . 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 NO PAST SURGERIES        Social History:  reports that she has been smoking. She has been smoking about 0.50 packs per day. She has never used smokeless tobacco. She reports previous alcohol use. She reports that she does not use drugs.  Family History: family history includes Cancer in her paternal grandmother; Diabetes in her maternal grandmother; Hearing loss in her maternal uncle; Vision loss in her mother.   HOME MEDICATIONS: Allergies as of 04/18/2019   No Known Allergies     Medication List       Accurate as of April 18, 2019 12:45 PM. If you have any questions, ask your nurse or doctor.        cabergoline 0.5 MG tablet Commonly known as: DOSTINEX Take 0.5 mg by mouth 2 (two) times a week.         REVIEW OF SYSTEMS: A comprehensive ROS was conducted with the patient and is negative except as per HPI and below:  ROS     OBJECTIVE:  VS: There were no vitals taken for this visit.   Wt  Readings from Last 3 Encounters:  10/23/18 187 lb 6.3 oz (85 kg)  09/21/18 197 lb (89.4 kg)  08/22/18 196 lb 6.4 oz (89.1 kg)     EXAM: General: Pt appears well and is in NAD  Hydration: Well-hydrated with moist mucous membranes and good skin turgor  Eyes: External eye exam normal without stare, lid lag or exophthalmos.  EOM intact.  PERRL.  Ears, Nose, Throat: Hearing: Grossly intact bilaterally Dental: Good dentition  Throat: Clear without mass, erythema or exudate  Neck: General: Supple without adenopathy. Thyroid: Thyroid size normal.  No goiter or nodules appreciated. No thyroid bruit.  Lungs: Clear with good BS bilat with no rales, rhonchi, or wheezes  Heart: Auscultation: RRR.  Abdomen: Normoactive bowel sounds, soft, nontender, without masses or organomegaly palpable  Extremities: Gait and station: Normal gait  Digits and nails: No clubbing, cyanosis, petechiae, or nodes Head and neck: Normal alignment and mobility BL UE: Normal ROM and strength. BL LE: No pretibial edema normal ROM and strength.  Skin: Hair: Texture and amount normal with gender appropriate distribution Skin Inspection: No rashes, acanthosis nigricans/skin tags. No lipohypertrophy Skin Palpation: Skin temperature, texture, and thickness normal to palpation  Neuro: Cranial nerves: II - XII grossly intact  Cerebellar: Normal coordination and movement; no  tremor Motor: Normal strength throughout DTRs: 2+ and symmetric in UE without delay in relaxation phase  Mental Status: Judgment, insight: Intact Orientation: Oriented to time, place, and person Memory: Intact for recent and remote events Mood and affect: No depression, anxiety, or agitation     DATA REVIEWED: 03/26/2019  Prolactin 759.1 ng/mL    07/14/2018 TSH 0.905 uIU/mL  LH 6.3 miu/mL FSH 5.7 miu/mL  Prolactin 584.1 ng/mL     ASSESSMENT/PLAN/RECOMMENDATIONS:   1. Prolactinoma:     Medications :  Signed electronically by: Mack Guise, MD  [facility] Endocrinology  [facility] [address] [place], [place] [address] Phone: [phone] FAX: [phone]   CC: Patient, No Pcp Per No address on file Phone: None Fax: None   Return to Endocrinology clinic as below: Future Appointments  Date Time Provider [place]  04/18/2019  2:20 PM , Melanie Crazier, MD [id number] None

## 2019-04-19 ENCOUNTER — Ambulatory Visit (INDEPENDENT_AMBULATORY_CARE_PROVIDER_SITE_OTHER): Payer: Medicaid Other | Admitting: Internal Medicine

## 2019-04-19 ENCOUNTER — Encounter: Payer: Self-pay | Admitting: Internal Medicine

## 2019-04-19 VITALS — BP 106/78 | HR 81 | Temp 98.4°F | Ht 64.0 in | Wt 185.2 lb

## 2019-04-19 DIAGNOSIS — D352 Benign neoplasm of pituitary gland: Secondary | ICD-10-CM

## 2019-04-19 LAB — TSH: TSH: 1.2 u[IU]/mL (ref 0.35–4.50)

## 2019-04-19 LAB — T4, FREE: Free T4: 0.69 ng/dL (ref 0.60–1.60)

## 2019-04-19 NOTE — Patient Instructions (Signed)
-   Continue Cabergoline 0.5 mg , 1 tablet twice a week  - We will contact you with any changes - Please talk to your primary care doctor or Gyn about starting contraception pills at this time.

## 2019-04-19 NOTE — Progress Notes (Signed)
Name: Patricia Morales  MRN/ DOB: [id number], April 27, 1991    Age/ Sex: 28 y.o., female    PCP: Vassie Moment, MD   Reason for Endocrinology Evaluation: Prolactinoma     Date of Initial Endocrinology Evaluation: 04/23/2019     HPI: Patricia Morales is a 28 y.o. female with a past medical history of Prolactinoma, Abnormal pap and hx of drug abuse. The patient presented for initial endocrinology clinic visit on 04/23/2019 for consultative assistance with her prolactinoma   She presented to the [facility] in 03/2019 with irregular periods . She was diagnosed with prlactinoma in 2012 during while living in [place], during evaluation for secondary amenorrhea. She has not had any follow up in the past 2-3 yrs.   PCP unable to obtain records from her endocrinologist in [place].    She was on Carbegoline during initial diagnosis,but during pregnancy she was switched to bromocriptine but was unable to tolerate it. She was lost to follow up after her move to [place]. Has been on Cabergoline since 07/2018 with intermittent compliance.    Today she denies any side effects with carbergoline Her periods have been regular.  Has occasional galactorrhea  Has occasional headaches with blurry vision   Sister with thyroid disease    HISTORY:  Past Medical History:  Past Medical History:  Diagnosis Date  . Prolactinoma [facility])    Past Surgical History:  Past Surgical History:  Procedure Laterality Date  . NO PAST SURGERIES        Social History:  reports that she has been smoking. She has been smoking about 0.50 packs per day. She has never used smokeless tobacco. She reports previous alcohol use. She reports that she does not use drugs.  Family History: family history includes Cancer in her paternal grandmother; Diabetes in her maternal grandmother; Hearing loss in her maternal uncle; Thyroid disease in her sister; Vision loss in her mother.   HOME MEDICATIONS: Allergies as  of 04/19/2019   No Known Allergies     Medication List       Accurate as of April 19, 2019 11:59 PM. If you have any questions, ask your nurse or doctor.        cabergoline 0.5 MG tablet Commonly known as: DOSTINEX Take 0.5 mg by mouth 2 (two) times a week.         REVIEW OF SYSTEMS: A comprehensive ROS was conducted with the patient and is negative except as per HPI and below:  Review of Systems  Eyes: Positive for blurred vision. Negative for pain.  Gastrointestinal: Positive for nausea. Negative for diarrhea.  Genitourinary: Negative for frequency.  Endo/Heme/Allergies: Positive for polydipsia.       OBJECTIVE:  VS: BP 106/78 (BP Location: Right Arm, Patient Position: Sitting, Cuff Size: Normal)   Pulse 81   Temp 98.4 F (36.9 C)   Ht 5\' 4"  (1.626 m)   Wt 185 lb 3.2 oz (84 kg)   LMP 03/05/2019   SpO2 97%   BMI 31.79 kg/m    Wt Readings from Last 3 Encounters:  04/19/19 185 lb 3.2 oz (84 kg)  10/23/18 187 lb 6.3 oz (85 kg)  09/21/18 197 lb (89.4 kg)     EXAM: General: Pt appears well and is in NAD  Hydration: Well-hydrated with moist mucous membranes and good skin turgor  Eyes: External eye exam normal without stare, lid lag or exophthalmos.  EOM intact.  Confrontation testing normal  Ears, Nose,  Throat: Hearing: Grossly intact bilaterally Dental: Good dentition  Throat: Clear without mass, erythema or exudate  Neck: General: Supple without adenopathy. Thyroid: Thyroid size normal.  No goiter or nodules appreciated. No thyroid bruit.  Lungs: Clear with good BS bilat with no rales, rhonchi, or wheezes  Heart: Auscultation: RRR.  Abdomen: Normoactive bowel sounds, soft, nontender, without masses or organomegaly palpable  Extremities:  BL LE: No pretibial edema normal ROM and strength.  Skin: Hair: Texture and amount normal with gender appropriate distribution Skin Inspection: No rashes. Skin Palpation: Skin temperature, texture, and thickness normal to  palpation  Neuro: Cranial nerves: II - XII grossly intact  Cerebellar: Normal coordination and movement; no tremor Motor: Normal strength throughout DTRs: 2+ and symmetric in UE without delay in relaxation phase  Mental Status: Judgment, insight: Intact Orientation: Oriented to time, place, and person Mood and affect: No depression, anxiety, or agitation     DATA REVIEWED: 03/26/2019  Prolactin 759.1 ng/mL    07/14/2018 TSH 0.905 uIU/mL  LH 6.3 miu/mL FSH 5.7 miu/mL  Prolactin 584.1 ng/mL       Results for Patricia Morales, Patricia Morales (MRN [id number]) as of 04/23/2019 08:36  Ref. Range 04/19/2019 08:21  Prolactin Latest Units: ng/mL 44.3 (H)  Preg, Serum Unknown NEGATIVE  TSH Latest Ref Range: 0.35 - 4.50 uIU/mL 1.20  T4,Free(Direct) Latest Ref Range: 0.60 - 1.60 ng/dL 0.69    ASSESSMENT/PLAN/RECOMMENDATIONS:   1. Prolactinoma:   - Diagnosed in 2012. Unknown prolactinoma size at initial diagnosis  - Hx of noncompliance due to cost and lack of insurance and cost of the medicine - Will check TFT and proceed with pituitary MRI  - Pt advised to start OCP if she is not currently pregnancy, until her prolactin levels have normalized and we have a better idea of the side of prolactinoma, we discussed that during prgenancy pituitary gland tends to enlarge and depending on the original size this could cause optic chiasm compression - She will also need a visual field testing through an opthalmologist   Medications : Increase Cabergoline 0.5 mg , 1 tablet three times a week    F/u 3 months    Signed electronically by: Mack Guise, MD  [facility] End[facility]  [facility] [address] [place], [place] [address] Phone: [phone] FAX: [phone]   CC: Vassie Moment, MD [address] [place] [address] Phone: [phone] Fax: [phone]   Return to Endocrinology clinic as below: Future Appointments  Date Time Provider [place]  07/20/2019  8:30 AM , Melanie Crazier, MD [id number] None

## 2019-04-20 LAB — PROLACTIN: Prolactin: 44.3 ng/mL — ABNORMAL HIGH

## 2019-04-20 LAB — HCG, SERUM, QUALITATIVE: Preg, Serum: NEGATIVE

## 2019-05-01 ENCOUNTER — Other Ambulatory Visit: Payer: Self-pay

## 2019-05-01 ENCOUNTER — Telehealth: Payer: Self-pay | Admitting: Internal Medicine

## 2019-05-01 MED ORDER — CABERGOLINE 0.5 MG PO TABS: 0.5000 mg | ORAL_TABLET | ORAL | 3 refills | Status: DC

## 2019-05-01 NOTE — Telephone Encounter (Signed)
MEDICATION: cabergoline (DOSTINEX) 0.5 MG tablet - new directions of taking 3 times a week  PHARMACY:  [id number]  IS THIS A 90 DAY SUPPLY :   IS PATIENT OUT OF MEDICATION:   IF NOT; HOW MUCH IS LEFT:8 pills   LAST APPOINTMENT DATE: @7 /11/2018  NEXT APPOINTMENT DATE:@10 /11/2018  DO WE HAVE YOUR PERMISSION TO LEAVE A DETAILED MESSAGE:  OTHER COMMENTS:    **Let patient know to contact pharmacy at the end of the day to make sure medication is ready. **  ** Please notify patient to allow 48-72 hours to process**  **Encourage patient to contact the pharmacy for refills or they can request refills through [facility]**

## 2019-05-29 ENCOUNTER — Other Ambulatory Visit: Payer: Medicaid Other

## 2019-06-22 ENCOUNTER — Encounter (HOSPITAL_COMMUNITY): Payer: Self-pay | Admitting: *Deleted

## 2019-06-22 ENCOUNTER — Inpatient Hospital Stay (HOSPITAL_COMMUNITY)
Admission: EM | Admit: 2019-06-22 | Discharge: 2019-06-22 | Disposition: A | Discharge status: Home / Self Care | Payer: Medicaid Other | Attending: Family Medicine | Admitting: Family Medicine

## 2019-06-22 ENCOUNTER — Other Ambulatory Visit: Payer: Self-pay

## 2019-06-22 ENCOUNTER — Inpatient Hospital Stay (HOSPITAL_COMMUNITY): Payer: Medicaid Other

## 2019-06-22 DIAGNOSIS — R109 Unspecified abdominal pain: Secondary | ICD-10-CM | POA: Insufficient documentation

## 2019-06-22 DIAGNOSIS — Z87891 Personal history of nicotine dependence: Secondary | ICD-10-CM | POA: Insufficient documentation

## 2019-06-22 DIAGNOSIS — B9689 Other specified bacterial agents as the cause of diseases classified elsewhere: Secondary | ICD-10-CM | POA: Diagnosis not present

## 2019-06-22 DIAGNOSIS — R102 Pelvic and perineal pain: Secondary | ICD-10-CM

## 2019-06-22 DIAGNOSIS — O26891 Other specified pregnancy related conditions, first trimester: Secondary | ICD-10-CM

## 2019-06-22 DIAGNOSIS — Z3A01 Less than 8 weeks gestation of pregnancy: Secondary | ICD-10-CM | POA: Diagnosis not present

## 2019-06-22 DIAGNOSIS — O23591 Infection of other part of genital tract in pregnancy, first trimester: Secondary | ICD-10-CM

## 2019-06-22 DIAGNOSIS — N760 Acute vaginitis: Secondary | ICD-10-CM

## 2019-06-22 DIAGNOSIS — Z6790 Unspecified blood type, Rh positive: Secondary | ICD-10-CM

## 2019-06-22 DIAGNOSIS — O26899 Other specified pregnancy related conditions, unspecified trimester: Secondary | ICD-10-CM

## 2019-06-22 HISTORY — DX: Unspecified abnormal cytological findings in specimens from vagina: R87.629

## 2019-06-22 HISTORY — DX: Headache, unspecified: R51.9

## 2019-06-22 HISTORY — DX: Anxiety disorder, unspecified: F41.9

## 2019-06-22 HISTORY — DX: Unspecified infectious disease: B99.9

## 2019-06-22 LAB — WET PREP, GENITAL
Sperm: NONE SEEN
Trich, Wet Prep: NONE SEEN
Yeast Wet Prep HPF POC: NONE SEEN

## 2019-06-22 LAB — URINALYSIS, ROUTINE W REFLEX MICROSCOPIC
Bilirubin Urine: NEGATIVE
Glucose, UA: NEGATIVE mg/dL
Hgb urine dipstick: NEGATIVE
Ketones, ur: NEGATIVE mg/dL
Nitrite: NEGATIVE
Protein, ur: NEGATIVE mg/dL
Specific Gravity, Urine: 1.021 (ref 1.005–1.030)
pH: 8 (ref 5.0–8.0)

## 2019-06-22 LAB — CBC
HCT: 28.7 % — ABNORMAL LOW (ref 36.0–46.0)
Hemoglobin: 10 g/dL — ABNORMAL LOW (ref 12.0–15.0)
MCH: 30.5 pg (ref 26.0–34.0)
MCHC: 34.8 g/dL (ref 30.0–36.0)
MCV: 87.5 fL (ref 80.0–100.0)
Platelets: 201 10*3/uL (ref 150–400)
RBC: 3.28 MIL/uL — ABNORMAL LOW (ref 3.87–5.11)
RDW: 12.9 % (ref 11.5–15.5)
WBC: 5.5 10*3/uL (ref 4.0–10.5)
nRBC: 0 % (ref 0.0–0.2)

## 2019-06-22 LAB — POC URINE PREG, ED: Preg Test, Ur: POSITIVE — AB

## 2019-06-22 LAB — HCG, QUANTITATIVE, PREGNANCY: hCG, Beta Chain, Quant, S: 13907 m[IU]/mL — ABNORMAL HIGH (ref <5)

## 2019-06-22 MED ORDER — METRONIDAZOLE 500 MG PO TABS: 500.0000 mg | ORAL_TABLET | Freq: Two times a day (BID) | ORAL | 0 refills | Status: AC

## 2019-06-22 NOTE — MAU Provider Note (Addendum)
History     CSN: [id number]  Arrival date and time: 06/22/19 1335   First Provider Initiated Contact with Patient 06/22/19 1531      Chief Complaint  Patient presents with  . Abdominal Pain   Ms. Patricia Morales is a 28 y.o. G2P1001 at [redacted]w[redacted]d who presents to MAU for pelvic and vaginal cramping.  Onset: 0130 this morning Location: pelvis/vagina Duration: <24hrs Character: "feels like a menstrual cramp" Aggravating/Associated: none/none Relieving: relaxing Treatment: none Severity: 7.5/10  Pt denies VB, LOF, ctx, decreased FM, vaginal discharge/odor/itching. Pt denies N/V, abdominal pain, constipation, diarrhea, or urinary problems. Pt denies fever, chills, fatigue, sweating or changes in appetite. Pt denies SOB or chest pain. Pt denies dizziness, HA, light-headedness, weakness.  Problems this pregnancy include: pt has not yet been seen, hx of anemia in previous pregnancy. Allergies? NKDA Current medications/supplements? PNVs, Reglan (last took 04/2019) Prenatal care provider? Pt does not have an appt scheduled, requests list, list given at discharge   OB History    Gravida  2   Para  1   Term  1   Preterm      AB      Living  1     SAB      TAB      Ectopic      Multiple      Live Births              Past Medical History:  Diagnosis Date  . Anxiety   . Headache   . Infection    UTI  . Prolactinoma ([place])   . Vaginal Pap smear, abnormal    colpo, in process of eval    Past Surgical History:  Procedure Laterality Date  . NO PAST SURGERIES      Family History  Problem Relation Age of Onset  . Vision loss Mother   . Multiple sclerosis Mother   . Hearing loss Maternal Uncle   . Diabetes Maternal Grandmother   . Cancer Paternal Grandmother   . Thyroid disease Sister     Social History   Tobacco Use  . Smoking status: Former Smoker    Packs/day: 0.50  . Smokeless tobacco: Never Used  . Tobacco comment: quit with +preg test   Substance Use Topics  . Alcohol use: Not Currently  . Drug use: No    Allergies: No Known Allergies  No medications prior to admission.   Review of Systems  Constitutional: Negative for chills, diaphoresis, fatigue and fever.  Respiratory: Negative for shortness of breath.   Cardiovascular: Negative for chest pain.  Gastrointestinal: Negative for abdominal pain, constipation, diarrhea, nausea and vomiting.  Genitourinary: Positive for pelvic pain. Negative for dysuria, flank pain, frequency, urgency, vaginal bleeding and vaginal discharge.  Neurological: Negative for dizziness, weakness, light-headedness and headaches.   Physical Exam   Blood pressure 113/65, pulse 76, temperature 99.6 F (37.6 C), temperature source Oral, resp. rate 16, last menstrual period 05/07/2019, SpO2 98 %.  Patient Vitals for the past 24 hrs:  BP Temp Temp src Pulse Resp SpO2  06/22/19 1847 113/65 - - 76 16 -  06/22/19 1445 102/60 - - 81 - -  06/22/19 1359 (!) 104/58 99.6 F (37.6 C) Oral 79 16 98 %   Physical Exam  Constitutional: She is oriented to person, place, and time. She appears well-developed and well-nourished. No distress.  HENT:  Head: Normocephalic and atraumatic.  Respiratory: Effort normal.  GI: Soft. She exhibits no distension and no mass.  There is no abdominal tenderness. There is no rebound and no guarding.  Genitourinary: There is no rash, tenderness or lesion on the right labia. There is no rash, tenderness or lesion on the left labia. Uterus is not enlarged and not tender. Cervix exhibits no motion tenderness, no discharge and no friability. Right adnexum displays no mass, no tenderness and no fullness. Left adnexum displays no mass, no tenderness and no fullness.    No vaginal discharge, tenderness or bleeding.  No tenderness or bleeding in the vagina.  Neurological: She is alert and oriented to person, place, and time.  Skin: Skin is warm and dry. She is not diaphoretic.   Psychiatric: She has a normal mood and affect. Her behavior is normal. Judgment and thought content normal.   Results for orders placed or performed during the hospital encounter of 06/22/19 (from the past 24 hour(s))  POC Urine Pregnancy, ED (not at [facility])     Status: Abnormal   Collection Time: 06/22/19  2:00 PM  Result Value Ref Range   Preg Test, Ur POSITIVE (A) NEGATIVE  ABO/Rh     Status: None   Collection Time: 06/22/19  3:40 PM  Result Value Ref Range   ABO/RH(D)      O POS Performed at [facility] [address], [place], [place] [address]   CBC     Status: Abnormal   Collection Time: 06/22/19  3:45 PM  Result Value Ref Range   WBC 5.5 4.0 - 10.5 K/uL   RBC 3.28 (L) 3.87 - 5.11 MIL/uL   Hemoglobin 10.0 (L) 12.0 - 15.0 g/dL   HCT 28.7 (L) 36.0 - 46.0 %   MCV 87.5 80.0 - 100.0 fL   MCH 30.5 26.0 - 34.0 pg   MCHC 34.8 30.0 - 36.0 g/dL   RDW 12.9 11.5 - 15.5 %   Platelets 201 150 - 400 K/uL   nRBC 0.0 0.0 - 0.2 %  hCG, quantitative, pregnancy     Status: Abnormal   Collection Time: 06/22/19  3:45 PM  Result Value Ref Range   hCG, Beta Chain, Quant, S 13,907 (H) <5 mIU/mL  Wet prep, genital     Status: Abnormal   Collection Time: 06/22/19  4:06 PM   Specimen: Cervical/Vaginal swab  Result Value Ref Range   Yeast Wet Prep HPF POC NONE SEEN NONE SEEN   Trich, Wet Prep NONE SEEN NONE SEEN   Clue Cells Wet Prep HPF POC PRESENT (A) NONE SEEN   WBC, Wet Prep HPF POC MODERATE (A) NONE SEEN   Sperm NONE SEEN   Urinalysis, Routine w reflex microscopic     Status: Abnormal   Collection Time: 06/22/19  5:45 PM  Result Value Ref Range   Color, Urine YELLOW YELLOW   APPearance HAZY (A) CLEAR   Specific Gravity, Urine 1.021 1.005 - 1.030   pH 8.0 5.0 - 8.0   Glucose, UA NEGATIVE NEGATIVE mg/dL   Hgb urine dipstick NEGATIVE NEGATIVE   Bilirubin Urine NEGATIVE NEGATIVE   Ketones, ur NEGATIVE NEGATIVE mg/dL   Protein, ur NEGATIVE NEGATIVE mg/dL   Nitrite NEGATIVE  NEGATIVE   Leukocytes,Ua TRACE (A) NEGATIVE   RBC / HPF 0-5 0 - 5 RBC/hpf   WBC, UA 0-5 0 - 5 WBC/hpf   Bacteria, UA RARE (A) NONE SEEN   Squamous Epithelial / LPF 0-5 0 - 5   US Ob Less Than 14 Weeks With Ob Transvaginal  Result Date: 06/22/2019 CLINICAL DATA:  Pregnant patient with pelvic cramping. EXAM: OBSTETRIC <14 WK [place] AND TRANSVAGINAL OB US TECHNIQUE: Both transabdominal and transvaginal ultrasound examinations were performed for complete evaluation of the gestation as well as the maternal uterus, adnexal regions, and pelvic cul-de-sac. Transvaginal technique was performed to assess early pregnancy. COMPARISON:  None. FINDINGS: Intrauterine gestational sac: Single. Yolk sac:  Visualized. Embryo:  Visualized. Cardiac Activity: Detected. Heart Rate: 111 bpm CRL:  2.2 mm   5 w   5 d                  [place] EDC: 02/17/2020. Subchorionic hemorrhage:  None visualized. Maternal uterus/adnexae: Appear normal. IMPRESSION: Single living intrauterine pregnancy.  No abnormality is identified. Electronically Signed   By: Inge Rise M.D.   On: 06/22/2019 17:57    MAU Course  Procedures  MDM -r/o ectopic -UA: hazy/trace leuks/rare bacteria, urine culture ordered -CBC: H/H 10/28.7 -[place]: single IUP, FHR 111, [redacted]w[redacted]d -hCG: 13,907 -ABO: O Positive -WetPrep: +ClueCells, mod WBCs, will treat based on symptoms -GC/CT collected -pt discharged to home in stable condition  Orders Placed This Encounter  Procedures  . Wet prep, genital    Standing Status:   Standing    Number of Occurrences:   1  . Culture, OB Urine    Standing Status:   Standing    Number of Occurrences:   1  . US OB LESS THAN 14 WEEKS WITH OB TRANSVAGINAL    Standing Status:   Standing    Number of Occurrences:   1    Order Specific Question:   Symptom/Reason for Exam    Answer:   Pelvic cramping in antepartum period [id number]  . Urinalysis, Routine w reflex microscopic    Standing Status:   Standing    Number of Occurrences:   1   . CBC    Standing Status:   Standing    Number of Occurrences:   1  . hCG, quantitative, pregnancy    Standing Status:   Standing    Number of Occurrences:   1  . POC Urine Pregnancy, ED (not at [facility])    Standing Status:   Standing    Number of Occurrences:   1  . ABO/Rh    Standing Status:   Standing    Number of Occurrences:   1  . Discharge patient    Order Specific Question:   Discharge disposition    Answer:   01-Home or Self Care [1]    Order Specific Question:   Discharge patient date    Answer:   06/22/2019   Meds ordered this encounter  Medications  . metroNIDAZOLE (FLAGYL) 500 MG tablet    Sig: Take 1 tablet (500 mg total) by mouth 2 (two) times daily for 7 days.    Dispense:  14 tablet    Refill:  0    Order Specific Question:   Supervising Provider    Answer:   Donnamae Jude [id number]   Assessment and Plan   1. Pelvic cramping in antepartum period   2. Blood type, Rh positive   3. [redacted] weeks gestation of pregnancy   4. Bacterial vaginosis    Allergies as of 06/22/2019   No Known Allergies     Medication List    TAKE these medications   cabergoline 0.5 MG tablet Commonly known as: DOSTINEX Take 1 tablet (0.5 mg total) by mouth 3 (three) times a week.   metroNIDAZOLE 500 MG tablet Commonly known as: Flagyl  Take 1 tablet (500 mg total) by mouth 2 (two) times daily for 7 days.      -will call with culture results, if positive -RX metronidazole -list of OB providers in [place] given -list of safe meds in pregnancy given -strict bleeding/pain/return MAU precautions given -pt to start PNVs -pt discharged to home in stable condition  Elmyra Ricks E Bohdan Macho 06/22/2019, 7:43 PM

## 2019-06-22 NOTE — MAU Note (Signed)
Pt working on calling someone to get child

## 2019-06-22 NOTE — MAU Note (Signed)
Sent up from ER, pregnant and cramping.

## 2019-06-22 NOTE — Discharge Instructions (Signed)
Abdominal Pain During Pregnancy  Abdominal pain is common during pregnancy, and has many possible causes. Some causes are more serious than others, and sometimes the cause is not known. Abdominal pain can be a sign that labor is starting. It can also be caused by normal growth and stretching of muscles and ligaments during pregnancy. Always tell your health care provider if you have any abdominal pain. Follow these instructions at home:  Do not have sex or put anything in your vagina until your pain goes away completely.  Get plenty of rest until your pain improves.  Drink enough fluid to keep your urine pale yellow.  Take over-the-counter and prescription medicines only as told by your health care provider.  Keep all follow-up visits as told by your health care provider. This is important. Contact a health care provider if:  Your pain continues or gets worse after resting.  You have lower abdominal pain that: ? Comes and goes at regular intervals. ? Spreads to your back. ? Is similar to menstrual cramps.  You have pain or burning when you urinate. Get help right away if:  You have a fever or chills.  You have vaginal bleeding.  You are leaking fluid from your vagina.  You are passing tissue from your vagina.  You have vomiting or diarrhea that lasts for more than 24 hours.  Your baby is moving less than usual.  You feel very weak or faint.  You have shortness of breath.  You develop severe pain in your upper abdomen. Summary  Abdominal pain is common during pregnancy, and has many possible causes.  If you experience abdominal pain during pregnancy, tell your health care provider right away.  Follow your health care provider's home care instructions and keep all follow-up visits as directed. This information is not intended to replace advice given to you by your health care provider. Make sure you discuss any questions you have with your health care  provider. Document Released: 10/04/2005 Document Revised: 01/22/2019 Document Reviewed: 01/06/2017 Elsevier Patient Education  2020 [facility]     Phone: [phone]  [facility] [place] [facility] [place]  Phone: [phone]  [facility] [employer] [facility] [place]  Phone: [place] [facility] [employer] at [place]                           Phone: [place] for [place] [facility]          Phone: [phone]  [facility]    Phone: [phone]   [facility])    Phone: [facility]    Phone: [phone]  [facility]    Phone: [place]    Phone: [phone]  [place] [facility]  Phone: [place]               Phone: [phone]  [facility]   Phone: [phone]  [facility]    Phone: [phone]                                  Safe Medications in Pregnancy    Acne: Benzoyl Peroxide Salicylic Acid  Backache/Headache: Tylenol: 2  regular strength every 4 hours OR              2 Extra strength every 6 hours  Colds/Coughs/Allergies: Benadryl (alcohol free) 25 mg every 6 hours as needed Breath right strips Claritin Cepacol throat lozenges Chloraseptic throat spray Cold-Eeze- up to three times per day Cough drops, alcohol free Flonase (by prescription only) Guaifenesin Mucinex Robitussin DM (plain only, alcohol free) Saline nasal spray/drops Sudafed (pseudoephedrine) & Actifed ** use only after [redacted] weeks gestation and if you do not have high blood pressure Tylenol Vicks Vaporub Zinc lozenges Zyrtec   Constipation: Colace Ducolax suppositories Fleet enema Glycerin  suppositories Metamucil Milk of magnesia Miralax Senokot Smooth move tea  Diarrhea: Kaopectate Imodium A-D  *NO pepto Bismol  Hemorrhoids: Anusol Anusol HC Preparation H Tucks  Indigestion: Tums Maalox Mylanta Zantac  Pepcid  Insomnia: Benadryl (alcohol free) 25mg  every 6 hours as needed Tylenol PM Unisom, no Gelcaps  Leg Cramps: Tums MagGel  Nausea/Vomiting:  Bonine Dramamine Emetrol Ginger extract Sea bands Meclizine  Nausea medication to take during pregnancy:  Unisom (doxylamine succinate 25 mg tablets) Take one tablet daily at bedtime. If symptoms are not adequately controlled, the dose can be increased to a maximum recommended dose of two tablets daily (1/2 tablet in the morning, 1/2 tablet mid-afternoon and one at bedtime). Vitamin B6 100mg  tablets. Take one tablet twice a day (up to 200 mg per day).  Skin Rashes: Aveeno products Benadryl cream or 25mg  every 6 hours as needed Calamine Lotion 1% cortisone cream  Yeast infection: Gyne-lotrimin 7 Monistat 7   **If taking multiple medications, please check labels to avoid duplicating the same active ingredients **take medication as directed on the label ** Do not exceed 4000 mg of tylenol in 24 hours **Do not take medications that contain aspirin or ibuprofen

## 2019-06-22 NOTE — MAU Note (Signed)
Unable to void at this time.

## 2019-06-22 NOTE — MAU Note (Signed)
Had a rough night last night, a lot of yelling and screaming.  Today is having some cramping in abd and vagina. Is kind of worried.

## 2019-06-22 NOTE — MAU Note (Signed)
Chaplain not available to watch child.

## 2019-06-23 LAB — ABO/RH: ABO/RH(D): O POS

## 2019-06-25 LAB — CULTURE, OB URINE

## 2019-06-26 ENCOUNTER — Encounter: Payer: Self-pay | Admitting: *Deleted

## 2019-06-27 LAB — GC/CHLAMYDIA PROBE AMP (~~LOC~~) NOT AT ARMC
Chlamydia: NEGATIVE
Neisseria Gonorrhea: NEGATIVE

## 2019-07-18 ENCOUNTER — Other Ambulatory Visit: Payer: Self-pay

## 2019-07-20 ENCOUNTER — Ambulatory Visit: Payer: Medicaid Other | Admitting: Internal Medicine

## 2019-07-23 ENCOUNTER — Ambulatory Visit (INDEPENDENT_AMBULATORY_CARE_PROVIDER_SITE_OTHER): Payer: Medicaid Other | Admitting: *Deleted

## 2019-07-23 ENCOUNTER — Other Ambulatory Visit: Payer: Self-pay

## 2019-07-23 ENCOUNTER — Encounter: Payer: Self-pay | Admitting: Internal Medicine

## 2019-07-23 ENCOUNTER — Ambulatory Visit (INDEPENDENT_AMBULATORY_CARE_PROVIDER_SITE_OTHER): Payer: Medicaid Other | Admitting: Internal Medicine

## 2019-07-23 VITALS — BP 114/60 | HR 83 | Temp 98.0°F | Ht 64.0 in | Wt 190.0 lb

## 2019-07-23 DIAGNOSIS — O0990 Supervision of high risk pregnancy, unspecified, unspecified trimester: Secondary | ICD-10-CM | POA: Insufficient documentation

## 2019-07-23 DIAGNOSIS — D352 Benign neoplasm of pituitary gland: Secondary | ICD-10-CM | POA: Diagnosis not present

## 2019-07-23 NOTE — Progress Notes (Signed)
Name: Patricia Morales  MRN/ DOB: [id number], Dec 18, 1990    Age/ Sex: 28 y.o., female     PCP: Vassie Moment, MD   Reason for Endocrinology Evaluation: Prolactinoma      Initial Endocrinology Clinic Visit: 04/23/2019    PATIENT IDENTIFIER: Patricia Morales is a 28 y.o., female with a past medical history of Prolactinoma, Abnormal pap and hx of drug abuse. She has followed with [facility] since 04/23/2019 for consultative assistance with management of her prolactinoma   HISTORICAL SUMMARY:  She was diagnosed with prolactinoma in 2012 while living in [place], this was detected during evaluation for secondary amenorrhea. There pt has chronic history of non-compliance. She did not have any medication evaluation for approximately 2-3 yrs prior to her presentation to the [facility] in 03/2019   PCP unable to obtain records from her endocrinologist in [place].   She was on Carbegoline during initial diagnosis,but during pregnancy she was switched to bromocriptine with reported intolerance. On her initial presentation to our clinic she had been on Cabergoline since 07/2018 with intermittent compliance.    She had a brain MRI scheduled for 05/29/19 but she did not show up.   SUBJECTIVE:   During last visit (04/23/2019): Increased Cabergoline to 1 tab three times weekly   Today (07/23/2019):  Ms. [name] is here for a 3 month follow up on prolactinoma. Last time I saw her was in 04/2019. She has not taken her cabergoline since her last visit here, telling me her insurance won;t cover for it. She did not show up for her brain MRI on 05/29/19.  She also has been found to be pregnant currently at ~ 11 weeks.    Denies headaches except for a slight headaches in the past 2 days. Denies vision changes    Has severe nausea.   Denies spotting   LMP 05/07/2019 EDD 02/17/2020  ROS:  As per HPI.   HISTORY:  Past Medical History:  Past Medical History:   Diagnosis Date  . Anxiety   . Headache   . Infection    UTI  . Prolactinoma ([place])   . Vaginal Pap smear, abnormal    colpo, in process of eval    Past Surgical History:  Past Surgical History:  Procedure Laterality Date  . NO PAST SURGERIES       Social History:  reports that she has quit smoking. She smoked 0.50 packs per day. She has never used smokeless tobacco. She reports previous alcohol use. She reports that she does not use drugs. Family History:  Family History  Problem Relation Age of Onset  . Vision loss Mother   . Multiple sclerosis Mother   . Hearing loss Maternal Uncle   . Diabetes Maternal Grandmother   . Cancer Paternal Grandmother   . Thyroid disease Sister       HOME MEDICATIONS: Allergies as of 07/23/2019   No Known Allergies     Medication List       Accurate as of July 23, 2019  8:55 AM. If you have any questions, ask your nurse or doctor.        cabergoline 0.5 MG tablet Commonly known as: DOSTINEX Take 1 tablet (0.5 mg total) by mouth 3 (three) times a week.         OBJECTIVE:   PHYSICAL EXAM: VS: BP 114/60 (BP Location: Left Arm, Patient Position: Sitting, Cuff Size: Large)   Pulse 83   Temp 98 F (36.7  C)   Ht 5\' 4"  (1.626 m)   Wt 190 lb (86.2 kg)   LMP 05/07/2019   SpO2 98%   BMI 32.61 kg/m    EXAM: General: Pt appears well and is in NAD  Neck: General: Supple without adenopathy. Thyroid: Thyroid size normal.  No goiter or nodules appreciated. No thyroid bruit.  Lungs: Clear with good BS bilat with no rales, rhonchi, or wheezes  Heart: Auscultation: RRR.  Abdomen: Normoactive bowel sounds, soft, nontender, without masses or organomegaly palpable  Extremities:  BL LE: No pretibial edema normal ROM and strength.  Skin: Hair: Texture and amount normal with gender appropriate distribution Skin Inspection: No rashes Skin Palpation: Skin temperature, texture, and thickness normal to palpation  Mental Status: Judgment,  insight: Intact Orientation: Oriented to time, place, and person Mood and affect: No depression, anxiety, or agitation     DATA REVIEWED:  Results for HARUMI, COGER (MRN [id number]) as of 07/23/2019 10:33  Ref. Range 04/19/2019 08:21  Prolactin Latest Units: ng/mL 44.3 (H)     Results for ADDILEY, FARA (MRN [id number]) as of 07/23/2019 10:33  Ref. Range 06/22/2019 14:00 06/22/2019 15:40 06/22/2019 15:45  Preg Test, Ur Latest Ref Range: NEGATIVE  POSITIVE (A)    HCG, Beta Chain, Quant, S Latest Ref Range: <5 mIU/mL   13,907 (H)   03/26/2019  Prolactin 759.1 ng/mL    07/14/2018 TSH 0.905 uIU/mL  LH 6.3 miu/mL FSH 5.7 miu/mL  Prolactin 584.1 ng/mL    ASSESSMENT / PLAN / RECOMMENDATIONS:   1. Prolactinoma:   - Diagnosed in 2012. Unknown prolactinoma size at initial diagnosis  - Hx of noncompliance . She dhas not taken cabergoline since her last visit here, she did not show up for her brain MRI.  - She is currently pregnant and not on prolactin.  - We discussed that during pregnancy prolactin levels are not checked , as they will be normally elevated, during pregnancy we assess her clinically. She was advised to contact us with severe or new onset headaches as well as visual field changes.  - She was again URGED to see an ophthalmologist for visual field testing   Medications : Remain off  Cabergoline    F/U in 3 months   Signed electronically by: Mack Guise, MD  [facility]  [address] [place], [place] [address] Phone: [phone] FAX: [phone]      CC: Vassie Moment, [place] [address] Phone: [phone]  Fax: [phone]   Return to Endocrinology clinic as below: Future Appointments  Date Time Provider [place]  07/23/2019  3:40 PM Shamleffer, Melanie Crazier, MD [facility] None

## 2019-07-23 NOTE — Progress Notes (Signed)
0830:  I called [facility] for her New OB Intake and she states she has another appointment at 0830 with endocrinology and she needs to reschedule this appointment. I informed her I can see she has endocrinology at 3:30 today; but she insists she has endocrinology  Now at 0830 and will need to reschedule this appointment for her new ob intake. I informed her I will have registrars reschedule and call her with new appointment.  Kim Lauver,RN

## 2019-07-23 NOTE — Patient Instructions (Signed)
-   Stay Off cabergoline during pregnancy   - Please schedule an visual field test through an Eye doctor ASAP   - Please contact us with severe headaches or visual changes

## 2019-08-13 ENCOUNTER — Ambulatory Visit (INDEPENDENT_AMBULATORY_CARE_PROVIDER_SITE_OTHER): Payer: Medicaid Other | Admitting: *Deleted

## 2019-08-13 ENCOUNTER — Other Ambulatory Visit: Payer: Self-pay

## 2019-08-13 DIAGNOSIS — O0990 Supervision of high risk pregnancy, unspecified, unspecified trimester: Secondary | ICD-10-CM

## 2019-08-13 DIAGNOSIS — O99210 Obesity complicating pregnancy, unspecified trimester: Secondary | ICD-10-CM

## 2019-08-13 DIAGNOSIS — D352 Benign neoplasm of pituitary gland: Secondary | ICD-10-CM

## 2019-08-13 DIAGNOSIS — F419 Anxiety disorder, unspecified: Secondary | ICD-10-CM

## 2019-08-13 DIAGNOSIS — R87810 Cervical high risk human papillomavirus (HPV) DNA test positive: Secondary | ICD-10-CM

## 2019-08-13 MED ORDER — BLOOD PRESSURE KIT DEVI: 1.0000 | 0 refills | Status: DC | PRN

## 2019-08-13 NOTE — Patient Instructions (Signed)

## 2019-08-13 NOTE — Progress Notes (Signed)
I connected with  Villa Herb on 08/13/19 at 10:30 AM EDT by telephone and verified that I am speaking with the correct person using two identifiers.   I discussed the limitations, risks, security and privacy concerns of performing an evaluation and management service by telephone and the availability of in person appointments. I also discussed with the patient that there may be a patient responsible charge related to this service. The patient expressed understanding and agreed to proceed. Explained I am completing her New OB Intake today. We discussed Her EDD and that it is based on  US done in [facility] and assigned by NP . I reviewed her allergies, meds, OB History, Medical /Surgical history, and appropriate screenings. I explained I will send her the [employer] app- app sent to her while on phone but she states she cannot download until after she is off the phone.  I explained we will send a blood pressure cuff to [facility] that will fill that prescription and they  will call her to verify her information. I asked her to bring the blood pressure cuff with her to her first ob appointment so we can show her how to use it. Explained  then we will have her take her blood pressure weekly and enter into the app. Explained she will have some visits in office and some virtually. I sent her a MyChart text and asked her to sign up for MyChart and download the  app. She states she will do after call is ended. I reviewed appointment date/ time with her , our location and to wear mask, no visitors. Explained she will have exam, ob bloodwork, hemoglobin a1C, cbg , genetic testing if desired, pap . I scheduled an [place] at 19 weeks and gave her the appointment. I offered her appointment with [facility] due to her history of anxiety which she accepted.  She voices understanding.  Everett Ricciardelli,RN 08/13/2019  10:36 AM

## 2019-08-24 NOTE — BH Specialist Note (Signed)
Integrated Behavioral Health Initial Visit  MRN: [id number] Name: Patricia Morales  Number of [place] Clinician visits:: 1/6 Session Start time: 9:52  Session End time: 10:00 Total time: 20  Type of Service: [place] Interpretor:No. Interpretor Name and Language: n/a   Warm Hand Off Completed.       SUBJECTIVE: Patricia Morales is a 28 y.o. female accompanied by n/a Patient was referred by Arlina Robes, MD for hx of anxiety. Patient reports the following symptoms/concerns: Pt states she has a history of anxiety, had a panic attack 4 months prior, after infidelity;  managing without medication (take a drive; listen to music).  Duration of problem: anxiety escalates during stressful times; mild anxiety at least since previous pregnancy; Severity of problem: mild  OBJECTIVE: Mood: Normal and Affect: Appropriate Risk of harm to self or others: No plan to harm self or others  LIFE CONTEXT: Family and Social: Pt lives with her 3yo daughter and FOB School/Work: - Self-Care: - Life Changes: Current pregnancy; infidelity in marriage  GOALS ADDRESSED: Patient will: 1. Maintain reduction of symptoms of: anxiety 2. Increase knowledge and/or ability of: healthy habits  3. Demonstrate ability to: Increase healthy adjustment to current life circumstances  INTERVENTIONS: Interventions utilized: Supportive Counseling and Psychoeducation and/or Health Education  Standardized Assessments completed: Not given today  ASSESSMENT: Patient currently experiencing Adjustment disorder with anxiety.   Patient may benefit from psychoeducation and brief therapeutic interventions regarding coping with symptoms of anxiety .  PLAN: 1. Follow up with behavioral health clinician on : As needed 2. Behavioral recommendations:  -Continue taking prenatal vitamin daily, as recommended by medical provider -Continue using self-coping for  escalation of anxiety (take a drive; listen to music) -Read educational materials regarding coping with symptoms of panic attacks -Consider trying out some apps, as discussed, to find one that will help best during future anxiety -Complete MyChart with text sent to phone today; MyChart help desk if needed  3. Referral(s): [facility] (In Clinic) 4. "From scale of 1-10, how likely are you to follow plan?": -  Garlan Fair, LCSW  Depression screen [facility] 2/9 08/13/2019 08/22/2018  Decreased Interest 1 0  Down, Depressed, Hopeless 0 0  PHQ - 2 Score 1 0  Altered sleeping 1 0  Tired, decreased energy 1 0  Change in appetite 0 0  Feeling bad or failure about yourself  0 0  Trouble concentrating 0 0  Moving slowly or fidgety/restless 0 0  Suicidal thoughts 0 0  PHQ-9 Score 3 0   GAD 7 : Generalized Anxiety Score 08/13/2019 08/22/2018  Nervous, Anxious, on Edge 0 1  Control/stop worrying 0 1  Worry too much - different things 0 0  Trouble relaxing 1 0  Restless 0 0  Easily annoyed or irritable 0 0  Afraid - awful might happen 0 0  Total GAD 7 Score 1 2

## 2019-08-27 ENCOUNTER — Other Ambulatory Visit: Payer: Self-pay

## 2019-08-27 ENCOUNTER — Other Ambulatory Visit (HOSPITAL_COMMUNITY)
Admission: RE | Admit: 2019-08-27 | Discharge: 2019-08-27 | Disposition: A | Discharge status: Home / Self Care | Payer: Medicaid Other | Source: Ambulatory Visit | Attending: Obstetrics and Gynecology | Admitting: Obstetrics and Gynecology

## 2019-08-27 ENCOUNTER — Ambulatory Visit (INDEPENDENT_AMBULATORY_CARE_PROVIDER_SITE_OTHER): Payer: Medicaid Other | Admitting: Clinical

## 2019-08-27 ENCOUNTER — Ambulatory Visit (INDEPENDENT_AMBULATORY_CARE_PROVIDER_SITE_OTHER): Payer: Medicaid Other | Admitting: Obstetrics and Gynecology

## 2019-08-27 ENCOUNTER — Encounter: Payer: Self-pay | Admitting: Obstetrics and Gynecology

## 2019-08-27 VITALS — BP 112/72 | HR 82 | Temp 98.2°F | Wt 194.6 lb

## 2019-08-27 DIAGNOSIS — O0992 Supervision of high risk pregnancy, unspecified, second trimester: Secondary | ICD-10-CM

## 2019-08-27 DIAGNOSIS — Z3A15 15 weeks gestation of pregnancy: Secondary | ICD-10-CM | POA: Diagnosis not present

## 2019-08-27 DIAGNOSIS — R87810 Cervical high risk human papillomavirus (HPV) DNA test positive: Secondary | ICD-10-CM

## 2019-08-27 DIAGNOSIS — D352 Benign neoplasm of pituitary gland: Secondary | ICD-10-CM

## 2019-08-27 DIAGNOSIS — O0990 Supervision of high risk pregnancy, unspecified, unspecified trimester: Secondary | ICD-10-CM

## 2019-08-27 DIAGNOSIS — F4322 Adjustment disorder with anxiety: Secondary | ICD-10-CM

## 2019-08-27 NOTE — Progress Notes (Signed)
Subjective:  Patricia Morales is a 28 y.o. G2P1001 at [redacted]w[redacted]d being seen today for first OB visit. EDD by first trimester U/S. H/O prolactinoma, followed by Endocrine. Last seen 10/20, has follow up in 3 months. H/O TSVD without problems. 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   She is currently monitored for the following issues for this high-risk pregnancy and has Pap smear of cervix shows high risk HPV present; Supervision of high risk pregnancy, antepartum; Prolactinoma ([place]); Obesity in pregnancy; and Anxiety on their problem list.  Patient reports no complaints.  Contractions: Not present. Vag. Bleeding: None.  Movement: Present. Denies leaking of fluid.   The following portions of the patient's history were reviewed and updated as appropriate: allergies, current medications, past family history, past medical history, past social history, past surgical history and problem list. Problem list updated.  Objective:   Vitals:   08/27/19 0845  BP: 112/72  Pulse: 82  Temp: 98.2 F (36.8 C)  Weight: 194 lb 9.6 oz (88.3 kg)    Fetal Status:     Movement: Present     General:  Alert, oriented and cooperative. Patient is in no acute distress.  Skin: Skin is warm and dry. No rash noted.   Cardiovascular: Normal heart rate noted  Respiratory: Normal respiratory effort, no problems with respiration noted  Abdomen: Soft, gravid, appropriate for gestational age. Pain/Pressure: Absent     Pelvic:  Cervical exam deferred        Extremities: Normal range of motion.  Edema: None  Mental Status: Normal mood and affect. Normal behavior. Normal judgment and thought content.   Urinalysis:      Assessment and Plan:  Pregnancy: G2P1001 at [redacted]w[redacted]d  1. Supervision of high risk pregnancy, antepartum Prenatal care and labs reviewed with pt today. Declined flu vaccine Genetic testing reviewed - Culture, OB Urine - Genetic Screening - Obstetric Panel, Including HIV - Hemoglobin A1c - Ambulatory  referral to Ophthalmology - Cytology - PAP( [place])  2. Prolactinoma ([place]) Stable Followed by Endocrine Referral to opth  3. Pap smear of cervix shows high risk HPV present  - Cytology - PAP( [place])  Preterm labor symptoms and general obstetric precautions including but not limited to vaginal bleeding, contractions, leaking of fluid and fetal movement were reviewed in detail with the patient. Please refer to After Visit Summary for other counseling recommendations.  Return in about 4 weeks (around 09/24/2019) for OB visit, face to face.   Chancy Milroy, MD

## 2019-08-27 NOTE — Progress Notes (Signed)
Medicaid Home Form Completed-08/27/2019

## 2019-08-27 NOTE — Patient Instructions (Signed)

## 2019-08-27 NOTE — Patient Instructions (Signed)
/Emotional Wellbeing Pharmacist, hospital Here are a few free apps meant to help you to help yourself.  To find, try searching on the internet to see if the app is offered on Apple/Android devices. If your first choice doesn't come up on your device, the good news is that there are many choices! Play around with different apps to see which ones are helpful to you.    Calm This is an app meant to help increase calm feelings. Includes info, strategies, and tools for tracking your feelings.      Calm Harm  This app is meant to help with self-harm. Provides many 5-minute or 15-min coping strategies for doing instead of hurting yourself.       [place] is a problem-solving tool to help deal with emotions and cope with stress you encounter wherever you are.      MindShift This app can help people cope with anxiety. Rather than trying to avoid anxiety, you can make an important shift and face it.      MY3  MY3 features a support system, safety plan and resources with the goal of offering a tool to use in a time of need.       My Life My Voice  This mood journal offers a simple solution for tracking your thoughts, feelings and moods. Animated emoticons can help identify your mood.       Relax Melodies Designed to help with sleep, on this app you can mix sounds and meditations for relaxation.      Smiling Mind Smiling Mind is meditation made easy: it's a simple tool that helps put a smile on your mind.        Stop, Breathe & Think  A friendly, simple guide for people through meditations for mindfulness and compassion.  Stop, Breathe and Think Kids Enter your current feelings and choose a "mission" to help you cope. Offers videos for certain moods instead of just sound recordings.       Team Orange The goal of this tool is to help teens change how they think, act, and react. This app helps you focus on your own good feelings and experiences.      The  [place] Box The [place] Box (VHB) contains simple tools to help patients with coping, relaxation, distraction, and positive thinking.    Coping with Panic Attacks   What is a panic attack?  You may have had a panic attack if you experienced four or more of the symptoms listed below coming on abruptly and peaking in about 10 minutes.  Panic Symptoms   . Pounding heart  . Sweating  . Trembling or shaking  . Shortness of breath  . Feeling of choking  . Chest pain  . Nausea or abdominal distress    . Feeling dizzy, unsteady, lightheaded, or faint  . Feelings of unreality or being detached from yourself  . Fear of losing control or going crazy  . Fear of dying  . Numbness or tingling  . Chills or hot flashes      Panic attacks are sometimes accompanied by avoidance of certain places or situations. These are often situations that would be difficult to escape from or in which help might not be available. Examples might include crowded shopping malls, public transportation, restaurants, or driving.   Why do panic attacks occur?   Panic attacks are the body's alarm system gone awry. All of [place] have a built-in alarm system,  powered by adrenaline, which increases our heart rate, breathing, and blood flow in response to danger. Ordinarily, this 'danger response system' works well. In some people, however, the response is either out of proportion to whatever stress is going on, or may come out of the blue without any stress at all.   For example, if you are walking in the woods and see a bear coming your way, a variety of changes occur in your body to prepare you to either fight the danger or flee from the situation. Your heart rate will increase to get more blood flow around your body, your breathing rate will quicken so that more oxygen is available, and your muscles will tighten in order to be ready to fight or run. You may feel nauseated as blood flow leaves your stomach area and moves  into your limbs. These bodily changes are all essential to helping you survive the dangerous situation. After the danger has passed, your body functions will begin to go back to normal. This is because your body also has a system for "recovering" by bringing your body back down to a normal state when the danger is over.   As you can see, the emergency response system is adaptive when there is, in fact, a "true" or "real" danger (e.g., bear). However, sometimes people find that their emergency response system is triggered in "everyday" situations where there really is no true physical danger (e.g., in a meeting, in the grocery store, while driving in normal traffic, etc.).   What triggers a panic attack?  Sometimes particularly stressful situations can trigger a panic attack. For example, an argument with your spouse or stressors at work can cause a stress response (activating the emergency response system) because you perceive it as threatening or overwhelming, even if there is no direct risk to your survival.  Sometimes panic attacks don't seem to be triggered by anything in particular- they may "come out of the blue". Somehow, the natural "fight or flight" emergency response system has gotten activated when there is no real danger. Why does the body go into "emergency mode" when there is no real danger?   Often, people with panic attacks are frightened or alarmed by the physical sensations of the emergency response system. First, unexpected physical sensations are experienced (tightness in your chest or some shortness of breath). This then leads to feeling fearful or alarmed by these symptoms ("Something's wrong!", "Am I having a heart attack?", "Am I going to faint?") The mind perceives that there is a danger even though no real danger exists. This, in turn, activates the emergency response system ("fight or flight"), leading to a "full blown" panic attack. In summary, panic attacks occur when we  misinterpret physical symptoms as signs of impending death, craziness, loss of control, embarrassment, or fear of fear. Sometimes you may be aware of thoughts of danger that activate the emergency response system (for example, thinking "I'm having a heart attack" when you feel chest pressure or increased heart rate). At other times, however, you may not be aware of such thoughts. After several incidences of being afraid of physical sensations, anxiety and panic can occur in response to the initial sensations without conscious thoughts of danger. Instead, you just feel afraid or alarmed. In other words, the panic or fear may seem to occur "automatically" without you consciously telling yourself anything.   After having had one or more panic attacks, you may also become more focused on what is going on inside  your body. You may scan your body and be more vigilant about noticing any symptoms that might signal the start of a panic attack. This makes it easier for panic attacks to happen again because you pick up on sensations you might otherwise not have noticed, and misinterpret them as something dangerous. A panic attack may then result.      How do I cope with panic attacks?  An important part of overcoming panic attacks involves re-interpreting your body's physical reactions and teaching yourself ways to decrease the physical arousal. This can be done through practicing the cognitive and behavioral interventions below.   Research has found that over half of people who have panic attacks show some signs of hyperventilation or overbreathing. This can produce initial sensations that alarm you and lead to a panic attack. Overbreathing can also develop as part of the panic attack and make the symptoms worse. When people hyperventilate, certain blood vessels in the body become narrower. In particular, the brain may get slightly less oxygen. This can lead to the symptoms of dizziness, confusion, and  lightheadedness that often occur during panic attacks. Other parts of the body may also get a bit less oxygen, which may lead to numbness or tingling in the hands or feet or the sensation of cold, clammy hands. It also may lead the heart to pump harder. Although these symptoms may be frightening and feel unpleasant, it is important to remember that hyperventilating is not dangerous. However, you can help overcome the unpleasantness of overbreathing by practicing Breathing Retraining.   Practice this basic technique three times a day, every day:  . Inhale. With your shoulders relaxed, inhale as slowly and deeply as you can while you count to six. If you can, use your diaphragm to fill your lungs with air.  . Hold. Keep the air in your lungs as you slowly count to four.  . Exhale. Slowly breath out as you count to six.  . Repeat. Do the inhale-hold-exhale cycle several times. Each time you do it, exhale for longer counts.  Like any new skill, Breathing Retraining requires practice. Try practicing this skill twice a day for several minutes. Initially, do not try this technique in specific situations or when you become frightened or have a panic attack. Begin by practicing in a quiet environment to build up your skill level so that you can later use it in time of "emergency."   2. Decreasing Avoidance  Regardless of whether you can identify why you began having panic attacks or whether they seemed to come out of the blue, the places where you began having panic attacks often can become triggers themselves. It is not uncommon for individuals to begin to avoid the places where they have had panic attacks. Over time, the individual may begin to avoid more and more places, thereby decreasing their activities and often negatively impacting their quality of life. To break the cycle of avoidance, it is important to first identify the places or situations that are being avoided, and then to do some "relearning."  To  begin this intervention, first create a list of locations or situations that you tend to avoid. Then choose an avoided location or situation that you would like to target first. Now develop an "exposure hierarchy" for this situation or location. An "exposure hierarchy" is a list of actions that make you feel anxious in this situation. Order these actions from least to most anxiety-producing. It is often helpful to have the first item  on your hierarchy involve thinking or imagining part of the feared/avoided situation.   Here is an example of an exposure hierarchy for decreasing avoidance of the grocery store. Note how it is ordered from the least amount of anxiety (at the top) to the most anxiety (at the bottom):  Marland Kitchen Think about going to the grocery store alone.  . Go to the grocery store with a friend or family member.  . Go to the grocery store alone to pick up a few small items (5-10 minutes in the store).  . Shopping for 10-20 minutes in the store alone.  . Doing the shopping for the week by myself (20-30 minutes in the store).   Your homework is to "expose" yourself to the lowest item on your hierarchy and use your breathing relaxation and coping statements (see below) to help you remain in the situation. Practice this several times during the upcoming week. Once you have mastered each item with minimal anxiety, move on to the next higher action on your list.   Cognitive Interventions  1. Identify your negative self-talk Anxious thoughts can increase anxiety symptoms and panic. The first step in changing anxious thinking is to identify your own negative, alarming self-talk. Some common alarming thoughts:  . I'm having a heart attack.            . I must be going crazy. . I think I'm dying. Marland Kitchen People will think I'm crazy. . I'm going to pass our.  . Oh no- here it comes.  . I can't stand this.  Rene Paci got to get out of here!  2. Use positive coping statements Changing or disrupting a  pattern of anxious thoughts by replacing them with more calming or supportive statements can help to divert a panic attack. Some common helpful coping statements:  . This is not an emergency.  . I don't like feeling this way, but I can accept it.  . I can feel like this and still be okay.  . This has happened before, and I was okay. I'll be okay this time, too.  . I can be anxious and still deal with this situation.

## 2019-08-28 LAB — OBSTETRIC PANEL, INCLUDING HIV
Antibody Screen: NEGATIVE
Basophils Absolute: 0 10*3/uL (ref 0.0–0.2)
Basos: 1 %
EOS (ABSOLUTE): 0.1 10*3/uL (ref 0.0–0.4)
Eos: 1 %
HIV Screen 4th Generation wRfx: NONREACTIVE
Hematocrit: 29.5 % — ABNORMAL LOW (ref 34.0–46.6)
Hemoglobin: 10.5 g/dL — ABNORMAL LOW (ref 11.1–15.9)
Hepatitis B Surface Ag: NEGATIVE
Immature Grans (Abs): 0 10*3/uL (ref 0.0–0.1)
Immature Granulocytes: 0 %
Lymphocytes Absolute: 2.1 10*3/uL (ref 0.7–3.1)
Lymphs: 45 %
MCH: 29.9 pg (ref 26.6–33.0)
MCHC: 35.6 g/dL (ref 31.5–35.7)
MCV: 84 fL (ref 79–97)
Monocytes Absolute: 0.4 10*3/uL (ref 0.1–0.9)
Monocytes: 9 %
Neutrophils Absolute: 2.1 10*3/uL (ref 1.4–7.0)
Neutrophils: 44 %
Platelets: 231 10*3/uL (ref 150–450)
RBC: 3.51 x10E6/uL — ABNORMAL LOW (ref 3.77–5.28)
RDW: 12.4 % (ref 11.7–15.4)
RPR Ser Ql: NONREACTIVE
Rh Factor: POSITIVE
Rubella Antibodies, IGG: 1.09 index (ref >0.99)
WBC: 4.7 10*3/uL (ref 3.4–10.8)

## 2019-08-28 LAB — HEMOGLOBIN A1C
Est. average glucose Bld gHb Est-mCnc: 108 mg/dL
Hgb A1c MFr Bld: 5.4 % (ref 4.8–5.6)

## 2019-08-29 ENCOUNTER — Other Ambulatory Visit: Payer: Self-pay | Admitting: *Deleted

## 2019-08-29 ENCOUNTER — Telehealth: Payer: Self-pay | Admitting: *Deleted

## 2019-08-29 DIAGNOSIS — O99019 Anemia complicating pregnancy, unspecified trimester: Secondary | ICD-10-CM | POA: Insufficient documentation

## 2019-08-29 DIAGNOSIS — O0990 Supervision of high risk pregnancy, unspecified, unspecified trimester: Secondary | ICD-10-CM

## 2019-08-29 LAB — URINE CULTURE, OB REFLEX

## 2019-08-29 LAB — CULTURE, OB URINE

## 2019-08-29 MED ORDER — FERROUS SULFATE 325 (65 FE) MG PO TABS: 325.0000 mg | ORAL_TABLET | Freq: Every day | ORAL | 3 refills | Status: DC

## 2019-08-29 NOTE — Telephone Encounter (Signed)
Discussed with lab and they instructed me to add the labs he requested and they will be added. I have sent in rx for iron to pharmacy. I will send mychart message to patient. Jalil Lorusso,RN

## 2019-08-29 NOTE — Progress Notes (Signed)
Orders added per Dr. Rip Harbour request

## 2019-08-29 NOTE — Telephone Encounter (Signed)
-----   Message from Chancy Milroy, MD sent at 08/28/2019  9:25 AM EST ----- Please send Rx for iron for this pt. Also add anemia labs to blood drawn on new OB appt. Thanks Legrand Como

## 2019-08-31 LAB — CYTOLOGY - PAP
Adequacy: ABSENT
Chlamydia: NEGATIVE
Comment: NEGATIVE
Comment: NEGATIVE
Comment: NORMAL
Diagnosis: NEGATIVE
High risk HPV: POSITIVE — AB
Neisseria Gonorrhea: NEGATIVE

## 2019-09-03 ENCOUNTER — Encounter: Payer: Self-pay | Admitting: *Deleted

## 2019-09-03 ENCOUNTER — Inpatient Hospital Stay (HOSPITAL_COMMUNITY)
Admission: RE | Admit: 2019-09-03 | Discharge: 2019-09-03 | Disposition: A | Discharge status: Home / Self Care | Payer: Medicaid Other | Source: Ambulatory Visit | Attending: Obstetrics and Gynecology | Admitting: Obstetrics and Gynecology

## 2019-09-10 ENCOUNTER — Encounter (HOSPITAL_COMMUNITY): Payer: Medicaid Other

## 2019-09-10 ENCOUNTER — Encounter: Payer: Self-pay | Admitting: *Deleted

## 2019-09-17 ENCOUNTER — Encounter: Payer: Self-pay | Admitting: *Deleted

## 2019-09-18 LAB — ANEMIA PROFILE B
Basophils Absolute: 0 10*3/uL (ref 0.0–0.2)
Basos: 0 %
EOS (ABSOLUTE): 0 10*3/uL (ref 0.0–0.4)
Eos: 1 %
Ferritin: 128 ng/mL (ref 15–150)
Folate: >20 ng/mL (ref >3.0)
Hematocrit: 31.8 % — ABNORMAL LOW (ref 34.0–46.6)
Hemoglobin: 10.4 g/dL — ABNORMAL LOW (ref 11.1–15.9)
Immature Grans (Abs): 0 10*3/uL (ref 0.0–0.1)
Immature Granulocytes: 0 %
Iron Saturation: 21 % (ref 15–55)
Iron: 73 ug/dL (ref 27–159)
Lymphocytes Absolute: 2.1 10*3/uL (ref 0.7–3.1)
Lymphs: 44 %
MCH: 30.1 pg (ref 26.6–33.0)
MCHC: 32.7 g/dL (ref 31.5–35.7)
MCV: 92 fL (ref 79–97)
Monocytes Absolute: 0.3 10*3/uL (ref 0.1–0.9)
Monocytes: 7 %
Neutrophils Absolute: 2.3 10*3/uL (ref 1.4–7.0)
Neutrophils: 48 %
Platelets: 266 10*3/uL (ref 150–450)
RBC: 3.45 x10E6/uL — ABNORMAL LOW (ref 3.77–5.28)
RDW: 13.8 % (ref 11.7–15.4)
Retic Ct Pct: 2.1 % (ref 0.6–2.6)
Total Iron Binding Capacity: 342 ug/dL (ref 250–450)
UIBC: 269 ug/dL (ref 131–425)
Vitamin B-12: 395 pg/mL (ref 232–1245)
WBC: 4.7 10*3/uL (ref 3.4–10.8)

## 2019-09-18 LAB — SPECIMEN STATUS REPORT

## 2019-09-21 ENCOUNTER — Encounter: Payer: Self-pay | Admitting: *Deleted

## 2019-09-24 ENCOUNTER — Ambulatory Visit (HOSPITAL_COMMUNITY): Payer: Medicaid Other | Admitting: *Deleted

## 2019-09-24 ENCOUNTER — Other Ambulatory Visit: Payer: Self-pay

## 2019-09-24 ENCOUNTER — Ambulatory Visit (HOSPITAL_COMMUNITY)
Admission: RE | Admit: 2019-09-24 | Discharge: 2019-09-24 | Disposition: A | Discharge status: Home / Self Care | Payer: Medicaid Other | Source: Ambulatory Visit | Attending: Obstetrics and Gynecology | Admitting: Obstetrics and Gynecology

## 2019-09-24 ENCOUNTER — Encounter (HOSPITAL_COMMUNITY): Payer: Self-pay | Admitting: *Deleted

## 2019-09-24 ENCOUNTER — Other Ambulatory Visit (HOSPITAL_COMMUNITY): Payer: Self-pay | Admitting: *Deleted

## 2019-09-24 DIAGNOSIS — F419 Anxiety disorder, unspecified: Secondary | ICD-10-CM | POA: Insufficient documentation

## 2019-09-24 DIAGNOSIS — O4592 Premature separation of placenta, unspecified, second trimester: Secondary | ICD-10-CM

## 2019-09-24 DIAGNOSIS — Z3A19 19 weeks gestation of pregnancy: Secondary | ICD-10-CM

## 2019-09-24 DIAGNOSIS — O2692 Pregnancy related conditions, unspecified, second trimester: Secondary | ICD-10-CM

## 2019-09-24 DIAGNOSIS — O99212 Obesity complicating pregnancy, second trimester: Secondary | ICD-10-CM

## 2019-09-24 DIAGNOSIS — Z3689 Encounter for other specified antenatal screening: Secondary | ICD-10-CM

## 2019-09-24 DIAGNOSIS — O99019 Anemia complicating pregnancy, unspecified trimester: Secondary | ICD-10-CM | POA: Insufficient documentation

## 2019-09-24 DIAGNOSIS — O99210 Obesity complicating pregnancy, unspecified trimester: Secondary | ICD-10-CM | POA: Diagnosis present

## 2019-09-24 DIAGNOSIS — O0990 Supervision of high risk pregnancy, unspecified, unspecified trimester: Secondary | ICD-10-CM | POA: Insufficient documentation

## 2019-09-26 ENCOUNTER — Ambulatory Visit (INDEPENDENT_AMBULATORY_CARE_PROVIDER_SITE_OTHER): Payer: Self-pay | Admitting: Medical

## 2019-09-26 ENCOUNTER — Encounter: Payer: Self-pay | Admitting: Medical

## 2019-09-26 ENCOUNTER — Telehealth: Payer: Self-pay

## 2019-09-26 ENCOUNTER — Other Ambulatory Visit: Payer: Self-pay

## 2019-09-26 VITALS — BP 111/66 | HR 89 | Wt 199.5 lb

## 2019-09-26 DIAGNOSIS — O0990 Supervision of high risk pregnancy, unspecified, unspecified trimester: Secondary | ICD-10-CM

## 2019-09-26 DIAGNOSIS — R87810 Cervical high risk human papillomavirus (HPV) DNA test positive: Secondary | ICD-10-CM

## 2019-09-26 DIAGNOSIS — O99212 Obesity complicating pregnancy, second trimester: Secondary | ICD-10-CM

## 2019-09-26 DIAGNOSIS — Z3A19 19 weeks gestation of pregnancy: Secondary | ICD-10-CM

## 2019-09-26 DIAGNOSIS — O99210 Obesity complicating pregnancy, unspecified trimester: Secondary | ICD-10-CM

## 2019-09-26 DIAGNOSIS — O0992 Supervision of high risk pregnancy, unspecified, second trimester: Secondary | ICD-10-CM

## 2019-09-26 NOTE — Progress Notes (Signed)
   PRENATAL VISIT NOTE  Subjective:  Patricia Morales is a 28 y.o. G2P1001 at [redacted]w[redacted]d being seen today for ongoing prenatal care.  She is currently monitored for the following issues for this high-risk pregnancy and has Pap smear of cervix shows high risk HPV present; Supervision of high risk pregnancy, antepartum; Prolactinoma ([place]); Obesity in pregnancy; Anxiety; and Anemia in pregnancy on their problem list.  Patient reports no complaints.  Contractions: Not present. Vag. Bleeding: None.  Movement: Present. Denies leaking of fluid.   The following portions of the patient's history were reviewed and updated as appropriate: allergies, current medications, past family history, past medical history, past social history, past surgical history and problem list.   Objective:   Vitals:   09/26/19 1409  BP: 111/66  Pulse: 89  Weight: 199 lb 8 oz (90.5 kg)    Fetal Status: Fetal Heart Rate (bpm): 152 Fundal Height: 20 cm Movement: Present     General:  Alert, oriented and cooperative. Patient is in no acute distress.  Skin: Skin is warm and dry. No rash noted.   Cardiovascular: Normal heart rate noted  Respiratory: Normal respiratory effort, no problems with respiration noted  Abdomen: Soft, gravid, appropriate for gestational age.  Pain/Pressure: Absent     Pelvic: Cervical exam deferred        Extremities: Normal range of motion.  Edema: Trace  Mental Status: Normal mood and affect. Normal behavior. Normal judgment and thought content.   Assessment and Plan:  Pregnancy: G2P1001 at [redacted]w[redacted]d 1. Supervision of high risk pregnancy, antepartum - Doing well - Declined AFP today  - Declined flu shot today  - Follow-up US scheduled 11/18/18 - Does not have BP cuff yet, CMA to check on order today   2. Obesity in pregnancy - Discussed daily 81 mg ASA, patient agrees to start   3. Pap smear of cervix shows high risk HPV present - Discussed pap results. Will need repeat in 1 year since +HPV and  absent Tzone   Preterm labor symptoms and general obstetric precautions including but not limited to vaginal bleeding, contractions, leaking of fluid and fetal movement were reviewed in detail with the patient. Please refer to After Visit Summary for other counseling recommendations.   Return in about 4 weeks (around 10/24/2019) for LOB, Virtual.  Future Appointments  Date Time Provider [place]  11/19/2019  2:15 PM [place] [facility]  11/19/2019  2:15 PM [facility] [place]    Kerry Hough, PA-C

## 2019-09-26 NOTE — Telephone Encounter (Signed)
Called Pt regarding Bp Cuff at [facility], they stated Cuff is ready for pick up, pt verbalized understanding.

## 2019-09-26 NOTE — Patient Instructions (Addendum)

## 2019-10-19 NOTE — L&D Delivery Note (Addendum)
OB/GYN Faculty Practice Delivery Note  Patricia Morales is a 29 y.o. G2P1001 s/p SVD at [redacted]w[redacted]d. She was admitted for SROM.   ROM: 20h 81m with clear fluid, AROM forebag rupture at 0645 4/28 GBS Status: GBS Positive/-- (04/05 1439), adequate abx given Maximum Maternal Temperature: 98.6 F  Labor Progress: . Initial SVE: 0.5/thick/-3. She was given Cytotex x1, then pitocin started with favorable cervix. Forebag AROM immediately before delivery. She then progressed to complete.   Delivery Date/Time: 02/13/20 at [phone] Delivery: Called to room and patient was complete and pushing. Head delivered LOA. Nuchal x 1 around neck, easily reducible. Shoulder and body delivered in usual fashion. Infant with spontaneous cry, placed on mother's abdomen, dried and stimulated. Cord clamped x 2 after 1-minute delay, and cut by grandmother of baby. Cord blood drawn. Placenta delivered spontaneously with gentle cord traction. Fundus firm with massage and Pitocin. Labia, perineum, vagina, and cervix inspected inspected with no lacerations.  Baby Weight: pending  Placenta: Sent to L&D Complications: Nuchal x 1, easily reducible Lacerations: None EBL: 75 mL Analgesia: Epidural   Infant:  APGAR (1 MIN): 8   APGAR (5 MINS): [address], DO, PGY1 02/13/2020, 7:03 AM  OB FELLOW DELIVERY ATTESTATION  I was gloved and present for the delivery in its entirety, and I agree with the above resident's note.    Barrington Ellison, MD [facility] Family Medicine Fellow, [facility] for [employer], [place]

## 2019-10-24 ENCOUNTER — Other Ambulatory Visit: Payer: Self-pay

## 2019-10-24 ENCOUNTER — Telehealth: Payer: Medicaid Other | Admitting: Obstetrics and Gynecology

## 2019-10-24 ENCOUNTER — Telehealth: Payer: Self-pay | Admitting: Medical

## 2019-10-24 DIAGNOSIS — O0990 Supervision of high risk pregnancy, unspecified, unspecified trimester: Secondary | ICD-10-CM

## 2019-10-24 DIAGNOSIS — O99210 Obesity complicating pregnancy, unspecified trimester: Secondary | ICD-10-CM

## 2019-10-24 DIAGNOSIS — D352 Benign neoplasm of pituitary gland: Secondary | ICD-10-CM

## 2019-10-24 DIAGNOSIS — O99019 Anemia complicating pregnancy, unspecified trimester: Secondary | ICD-10-CM

## 2019-10-24 NOTE — Progress Notes (Signed)
Called patient for appt today- she states she forgot about appt and would like it rescheduled. Told patient someone from the front will call her to reschedule.

## 2019-10-24 NOTE — Progress Notes (Signed)
Pt answered call but requests to reschedule OB appt.

## 2019-10-29 ENCOUNTER — Telehealth (INDEPENDENT_AMBULATORY_CARE_PROVIDER_SITE_OTHER): Payer: Medicaid Other | Admitting: Family Medicine

## 2019-10-29 ENCOUNTER — Other Ambulatory Visit: Payer: Self-pay

## 2019-10-29 DIAGNOSIS — O0990 Supervision of high risk pregnancy, unspecified, unspecified trimester: Secondary | ICD-10-CM

## 2019-10-29 DIAGNOSIS — O0992 Supervision of high risk pregnancy, unspecified, second trimester: Secondary | ICD-10-CM | POA: Diagnosis not present

## 2019-10-29 DIAGNOSIS — O99012 Anemia complicating pregnancy, second trimester: Secondary | ICD-10-CM | POA: Diagnosis not present

## 2019-10-29 DIAGNOSIS — O99212 Obesity complicating pregnancy, second trimester: Secondary | ICD-10-CM

## 2019-10-29 DIAGNOSIS — Z3A24 24 weeks gestation of pregnancy: Secondary | ICD-10-CM

## 2019-10-29 DIAGNOSIS — R87810 Cervical high risk human papillomavirus (HPV) DNA test positive: Secondary | ICD-10-CM

## 2019-10-29 DIAGNOSIS — O99210 Obesity complicating pregnancy, unspecified trimester: Secondary | ICD-10-CM

## 2019-10-29 DIAGNOSIS — F419 Anxiety disorder, unspecified: Secondary | ICD-10-CM

## 2019-10-29 DIAGNOSIS — O99019 Anemia complicating pregnancy, unspecified trimester: Secondary | ICD-10-CM

## 2019-10-29 DIAGNOSIS — D352 Benign neoplasm of pituitary gland: Secondary | ICD-10-CM

## 2019-10-29 NOTE — Progress Notes (Signed)
   TELEHEALTH VIRTUAL OBSTETRICS VISIT ENCOUNTER NOTE  I connected with Patricia Morales on 10/29/19 at 11:15 AM EST by telephone at home and verified that I am speaking with the correct person using two identifiers.   I discussed the limitations, risks, security and privacy concerns of performing an evaluation and management service by telephone and the availability of in person appointments. I also discussed with the patient that there may be a patient responsible charge related to this service. The patient expressed understanding and agreed to proceed.  Subjective:  Patricia Morales is a 29 y.o. G2P1001 at [redacted]w[redacted]d being followed for ongoing prenatal care.  She is currently monitored for the following issues for this high-risk pregnancy and has Pap smear of cervix shows high risk HPV present; Supervision of high risk pregnancy, antepartum; Prolactinoma ([place]); Obesity in pregnancy; Anxiety; and Anemia in pregnancy on their problem list.  Patient reports occasional headaches- has not taken tylenol. Had in previous pregnancy, no history of Pre-E. Reports fetal movement. Denies any contractions, bleeding or leaking of fluid.   The following portions of the patient's history were reviewed and updated as appropriate: allergies, current medications, past family history, past medical history, past social history, past surgical history and problem list.   Objective:   General:  Alert, oriented and cooperative.   Mental Status: Normal mood and affect perceived. Normal judgment and thought content.  Rest of physical exam deferred due to type of encounter  Assessment and Plan:  Pregnancy: G2P1001 at [redacted]w[redacted]d  1. Antepartum anemia - Hgb 10.4, Iron studies normal  2. Obesity in pregnancy - A1c 08/20/2019: 5.4%  3. Anxiety - Follows with [facility], LCSW  4. Supervision of high risk pregnancy, antepartum - Continue routine prenatal care - Will get BP cuff today - Discussed taking BP, and if BP  elevated to call office, if BP >160/>110 to go to MAU for evaluation  5. Prolactinoma ([place]) - Stable, follows with Endocrine  6. Pap smear of cervix shows high risk HPV present -repeat Pap 08/2020  Preterm labor symptoms and general obstetric precautions including but not limited to vaginal bleeding, contractions, leaking of fluid and fetal movement were reviewed in detail with the patient.  I discussed the assessment and treatment plan with the patient. The patient was provided an opportunity to ask questions and all were answered. The patient agreed with the plan and demonstrated an understanding of the instructions. The patient was advised to call back or seek an in-person office evaluation/go to [facility]U at [facility] for any urgent or concerning symptoms. Please refer to After Visit Summary for other counseling recommendations.   I provided 25 minutes of non-face-to-face time during this encounter.  Return in about 2 weeks (around 11/12/2019), or HOB- in person, 2 hour GTT.  Future Appointments  Date Time Provider [place]  11/19/2019  2:15 PM [place] [facility]  11/19/2019  2:15 PM [place] for [employer], [place]

## 2019-10-29 NOTE — Patient Instructions (Signed)
Glucose Tolerance Test During Pregnancy Why am I having this test? The glucose tolerance test (GTT) is done to check how your body processes sugar (glucose). This is one of several tests used to diagnose diabetes that develops during pregnancy (gestational diabetes mellitus). Gestational diabetes is a temporary form of diabetes that some women develop during pregnancy. It usually occurs during the second trimester of pregnancy and goes away after delivery. Testing (screening) for gestational diabetes usually occurs between 24 and 28 weeks of pregnancy. You may have the GTT test after having a 1-hour glucose screening test if the results from that test indicate that you may have gestational diabetes. You may also have this test if:  You have a history of gestational diabetes.  You have a history of giving birth to very large babies or have experienced repeated fetal loss (stillbirth).  You have signs and symptoms of diabetes, such as: ? Changes in your vision. ? Tingling or numbness in your hands or feet. ? Changes in hunger, thirst, and urination that are not otherwise explained by your pregnancy. What is being tested? This test measures the amount of glucose in your blood at different times during a period of 3 hours. This indicates how well your body is able to process glucose. What kind of sample is taken?  Blood samples are required for this test. They are usually collected by inserting a needle into a blood vessel. How do I prepare for this test?  For 3 days before your test, eat normally. Have plenty of carbohydrate-rich foods.  Follow instructions from your health care provider about: ? Eating or drinking restrictions on the day of the test. You may be asked to not eat or drink anything other than water (fast) starting 8-10 hours before the test. ? Changing or stopping your regular medicines. Some medicines may interfere with this test. Tell a health care provider about:  All  medicines you are taking, including vitamins, herbs, eye drops, creams, and over-the-counter medicines.  Any blood disorders you have.  Any surgeries you have had.  Any medical conditions you have. What happens during the test? First, your blood glucose will be measured. This is referred to as your fasting blood glucose, since you fasted before the test. Then, you will drink a glucose solution that contains a certain amount of glucose. Your blood glucose will be measured again 1, 2, and 3 hours after drinking the solution. This test takes about 3 hours to complete. You will need to stay at the testing location during this time. During the testing period:  Do not eat or drink anything other than the glucose solution.  Do not exercise.  Do not use any products that contain nicotine or tobacco, such as cigarettes and e-cigarettes. If you need help stopping, ask your health care provider. The testing procedure may vary among health care providers and hospitals. How are the results reported? Your results will be reported as milligrams of glucose per deciliter of blood (mg/dL) or millimoles per liter (mmol/L). Your health care provider will compare your results to normal ranges that were established after testing a large group of people (reference ranges). Reference ranges may vary among labs and hospitals. For this test, common reference ranges are:  Fasting: less than 95-105 mg/dL (5.3-5.8 mmol/L).  1 hour after drinking glucose: less than 180-190 mg/dL (10.0-10.5 mmol/L).  2 hours after drinking glucose: less than 155-165 mg/dL (8.6-9.2 mmol/L).  3 hours after drinking glucose: 140-145 mg/dL (7.8-8.1 mmol/L). What do the   results mean? Results within reference ranges are considered normal, meaning that your glucose levels are well-controlled. If two or more of your blood glucose levels are high, you may be diagnosed with gestational diabetes. If only one level is high, your health care  provider may suggest repeat testing or other tests to confirm a diagnosis. Talk with your health care provider about what your results mean. Questions to ask your health care provider Ask your health care provider, or the department that is doing the test:  When will my results be ready?  How will I get my results?  What are my treatment options?  What other tests do I need?  What are my next steps? Summary  The glucose tolerance test (GTT) is one of several tests used to diagnose diabetes that develops during pregnancy (gestational diabetes mellitus). Gestational diabetes is a temporary form of diabetes that some women develop during pregnancy.  You may have the GTT test after having a 1-hour glucose screening test if the results from that test indicate that you may have gestational diabetes. You may also have this test if you have any symptoms or risk factors for gestational diabetes.  Talk with your health care provider about what your results mean. This information is not intended to replace advice given to you by your health care provider. Make sure you discuss any questions you have with your health care provider. Document Revised: 01/25/2019 Document Reviewed: 05/16/2017 Elsevier Patient Education  2020 Elsevier Inc.  

## 2019-10-29 NOTE — Progress Notes (Signed)
I connected with  Patricia Morales on 10/29/19 at 1120 by telephone and verified that I am speaking with the correct person using two identifiers.   I discussed the limitations, risks, security and privacy concerns of performing an evaluation and management service by telephone and the availability of in person appointments. I also discussed with the patient that there may be a patient responsible charge related to this service. The patient expressed understanding and agreed to proceed.  Annabell Howells, RN 10/29/2019  11:22 AM

## 2019-11-14 ENCOUNTER — Other Ambulatory Visit: Payer: Self-pay | Admitting: Lactation Services

## 2019-11-14 DIAGNOSIS — O0990 Supervision of high risk pregnancy, unspecified, unspecified trimester: Secondary | ICD-10-CM

## 2019-11-14 NOTE — Addendum Note (Signed)
Addended by: Donn Pierini on: 11/14/2019 08:24 AM   Modules accepted: Orders

## 2019-11-15 ENCOUNTER — Encounter: Payer: Self-pay | Admitting: Obstetrics and Gynecology

## 2019-11-15 ENCOUNTER — Other Ambulatory Visit: Payer: Medicaid Other

## 2019-11-15 ENCOUNTER — Other Ambulatory Visit: Payer: Self-pay

## 2019-11-15 ENCOUNTER — Ambulatory Visit (INDEPENDENT_AMBULATORY_CARE_PROVIDER_SITE_OTHER): Payer: Medicaid Other | Admitting: Obstetrics and Gynecology

## 2019-11-15 VITALS — BP 102/76 | HR 98 | Wt 205.2 lb

## 2019-11-15 DIAGNOSIS — O99019 Anemia complicating pregnancy, unspecified trimester: Secondary | ICD-10-CM

## 2019-11-15 DIAGNOSIS — O99012 Anemia complicating pregnancy, second trimester: Secondary | ICD-10-CM

## 2019-11-15 DIAGNOSIS — Z23 Encounter for immunization: Secondary | ICD-10-CM | POA: Diagnosis not present

## 2019-11-15 DIAGNOSIS — O0992 Supervision of high risk pregnancy, unspecified, second trimester: Secondary | ICD-10-CM

## 2019-11-15 DIAGNOSIS — O0990 Supervision of high risk pregnancy, unspecified, unspecified trimester: Secondary | ICD-10-CM

## 2019-11-15 DIAGNOSIS — O99013 Anemia complicating pregnancy, third trimester: Secondary | ICD-10-CM

## 2019-11-15 DIAGNOSIS — O99210 Obesity complicating pregnancy, unspecified trimester: Secondary | ICD-10-CM

## 2019-11-15 DIAGNOSIS — Z3A26 26 weeks gestation of pregnancy: Secondary | ICD-10-CM | POA: Diagnosis not present

## 2019-11-15 MED ORDER — DOCUSATE SODIUM 100 MG PO CAPS: 100.0000 mg | ORAL_CAPSULE | Freq: Two times a day (BID) | ORAL | 2 refills | Status: DC | PRN

## 2019-11-15 MED ORDER — CYCLOBENZAPRINE HCL 10 MG PO TABS: 10.0000 mg | ORAL_TABLET | Freq: Three times a day (TID) | ORAL | 2 refills | Status: DC | PRN

## 2019-11-15 MED ORDER — PHENYLEPH-SHARK LIV OIL-MO-PET 0.25-3-14-71.9 % RE OINT: 1.0000 "application " | TOPICAL_OINTMENT | Freq: Two times a day (BID) | RECTAL | 0 refills | Status: DC | PRN

## 2019-11-15 MED ORDER — WITCH HAZEL-GLYCERIN EX PADS: 1.0000 "application " | MEDICATED_PAD | CUTANEOUS | 12 refills | Status: DC | PRN

## 2019-11-15 MED ORDER — FERROUS SULFATE 325 (65 FE) MG PO TABS: 325.0000 mg | ORAL_TABLET | Freq: Every day | ORAL | 3 refills | Status: DC

## 2019-11-15 NOTE — Progress Notes (Signed)
Pt states that neck has been hurting for 5 days now, bowel movement has been bloody for the last 3 bowel movements.

## 2019-11-15 NOTE — Progress Notes (Signed)
   PRENATAL VISIT NOTE  Subjective:  Patricia Morales is a 29 y.o. G2P1001 at [redacted]w[redacted]d being seen today for ongoing prenatal care.  She is currently monitored for the following issues for this low-risk pregnancy and has Pap smear of cervix shows high risk HPV present; Supervision of high risk pregnancy, antepartum; Prolactinoma ([place]); Obesity in pregnancy; Anxiety; and Anemia in pregnancy on their problem list.  Patient reports neck pain and blood in stool.  Contractions: Not present. Vag. Bleeding: None.  Movement: Present. Denies leaking of fluid.   The following portions of the patient's history were reviewed and updated as appropriate: allergies, current medications, past family history, past medical history, past social history, past surgical history and problem list.   Objective:   Vitals:   11/15/19 0916  BP: 102/76  Pulse: 98  Weight: 205 lb 3.2 oz (93.1 kg)    Fetal Status: Fetal Heart Rate (bpm): 152 Fundal Height: 28 cm Movement: Present     General:  Alert, oriented and cooperative. Patient is in no acute distress.  Skin: Skin is warm and dry. No rash noted.   Cardiovascular: Normal heart rate noted  Respiratory: Normal respiratory effort, no problems with respiration noted  Abdomen: Soft, gravid, appropriate for gestational age.  Pain/Pressure: Present     Pelvic: Cervical exam deferred        Extremities: Normal range of motion.  Edema: None  Mental Status: Normal mood and affect. Normal behavior. Normal judgment and thought content.   Assessment and Plan:  Pregnancy: G2P1001 at [redacted]w[redacted]d 1. Supervision of high risk pregnancy, antepartum Patient is doing obstetrically well Rx flexeril provided for neck pain Patient instructed to increase water and fiber intake to help with constipation. Rx colace provided . Patient admits to having hemorrhoids. Rx for preparation H and witch- hazel provided Patient did not fast for third trimester labs today. We will reschedule  glucola tdap today  2. Anemia during pregnancy in third trimester Patient has not been taking iron- Rx provided  3. Obesity in pregnancy   Preterm labor symptoms and general obstetric precautions including but not limited to vaginal bleeding, contractions, leaking of fluid and fetal movement were reviewed in detail with the patient. Please refer to After Visit Summary for other counseling recommendations.   Return in about 2 weeks (around 11/29/2019) for ROB, Low risk, in person, 2 hr glucola next visit.  Future Appointments  Date Time Provider [place]  11/19/2019  2:15 PM [place] [facility]  11/19/2019  2:15 PM [place] 4 [facility]  11/21/2019  9:10 AM [facility]    Mora Bellman, MD

## 2019-11-19 ENCOUNTER — Other Ambulatory Visit: Payer: Self-pay

## 2019-11-19 ENCOUNTER — Ambulatory Visit (HOSPITAL_COMMUNITY)
Admission: RE | Admit: 2019-11-19 | Discharge: 2019-11-19 | Disposition: A | Discharge status: Home / Self Care | Payer: Medicaid Other | Source: Ambulatory Visit | Attending: Obstetrics and Gynecology | Admitting: Obstetrics and Gynecology

## 2019-11-19 ENCOUNTER — Other Ambulatory Visit (HOSPITAL_COMMUNITY): Payer: Self-pay | Admitting: *Deleted

## 2019-11-19 ENCOUNTER — Ambulatory Visit (HOSPITAL_COMMUNITY): Payer: Medicaid Other | Admitting: *Deleted

## 2019-11-19 DIAGNOSIS — Z3689 Encounter for other specified antenatal screening: Secondary | ICD-10-CM | POA: Insufficient documentation

## 2019-11-19 DIAGNOSIS — D352 Benign neoplasm of pituitary gland: Secondary | ICD-10-CM

## 2019-11-19 DIAGNOSIS — O99210 Obesity complicating pregnancy, unspecified trimester: Secondary | ICD-10-CM | POA: Diagnosis present

## 2019-11-19 DIAGNOSIS — O99013 Anemia complicating pregnancy, third trimester: Secondary | ICD-10-CM | POA: Diagnosis present

## 2019-11-19 DIAGNOSIS — F419 Anxiety disorder, unspecified: Secondary | ICD-10-CM

## 2019-11-19 DIAGNOSIS — Z3A27 27 weeks gestation of pregnancy: Secondary | ICD-10-CM

## 2019-11-19 DIAGNOSIS — O2692 Pregnancy related conditions, unspecified, second trimester: Secondary | ICD-10-CM

## 2019-11-19 DIAGNOSIS — Z362 Encounter for other antenatal screening follow-up: Secondary | ICD-10-CM

## 2019-11-19 DIAGNOSIS — O4592 Premature separation of placenta, unspecified, second trimester: Secondary | ICD-10-CM

## 2019-11-19 DIAGNOSIS — O99212 Obesity complicating pregnancy, second trimester: Secondary | ICD-10-CM

## 2019-11-21 ENCOUNTER — Other Ambulatory Visit: Payer: Medicaid Other

## 2019-11-21 ENCOUNTER — Other Ambulatory Visit: Payer: Self-pay | Admitting: Lactation Services

## 2019-11-21 ENCOUNTER — Other Ambulatory Visit: Payer: Self-pay

## 2019-11-21 DIAGNOSIS — O0990 Supervision of high risk pregnancy, unspecified, unspecified trimester: Secondary | ICD-10-CM

## 2019-11-21 NOTE — Addendum Note (Signed)
Addended by: Donn Pierini on: 11/21/2019 09:25 AM   Modules accepted: Orders

## 2019-11-22 LAB — HIV ANTIBODY (ROUTINE TESTING W REFLEX): HIV Screen 4th Generation wRfx: NONREACTIVE

## 2019-11-22 LAB — CBC
Hematocrit: 29.9 % — ABNORMAL LOW (ref 34.0–46.6)
Hemoglobin: 10.2 g/dL — ABNORMAL LOW (ref 11.1–15.9)
MCH: 30 pg (ref 26.6–33.0)
MCHC: 34.1 g/dL (ref 31.5–35.7)
MCV: 88 fL (ref 79–97)
Platelets: 234 10*3/uL (ref 150–450)
RBC: 3.4 x10E6/uL — ABNORMAL LOW (ref 3.77–5.28)
RDW: 13.2 % (ref 11.7–15.4)
WBC: 5.9 10*3/uL (ref 3.4–10.8)

## 2019-11-22 LAB — RPR: RPR Ser Ql: NONREACTIVE

## 2019-11-22 LAB — GLUCOSE TOLERANCE, 2 HOURS W/ 1HR
Glucose, 1 hour: 107 mg/dL (ref 65–179)
Glucose, 2 hour: 108 mg/dL (ref 65–152)
Glucose, Fasting: 80 mg/dL (ref 65–91)

## 2019-11-29 ENCOUNTER — Telehealth (INDEPENDENT_AMBULATORY_CARE_PROVIDER_SITE_OTHER): Payer: Medicaid Other | Admitting: Obstetrics and Gynecology

## 2019-11-29 DIAGNOSIS — O0990 Supervision of high risk pregnancy, unspecified, unspecified trimester: Secondary | ICD-10-CM

## 2019-11-29 DIAGNOSIS — O99013 Anemia complicating pregnancy, third trimester: Secondary | ICD-10-CM

## 2019-11-29 DIAGNOSIS — O99210 Obesity complicating pregnancy, unspecified trimester: Secondary | ICD-10-CM

## 2019-11-29 DIAGNOSIS — Z3A28 28 weeks gestation of pregnancy: Secondary | ICD-10-CM

## 2019-11-29 DIAGNOSIS — O99213 Obesity complicating pregnancy, third trimester: Secondary | ICD-10-CM

## 2019-11-29 DIAGNOSIS — O0993 Supervision of high risk pregnancy, unspecified, third trimester: Secondary | ICD-10-CM

## 2019-11-29 DIAGNOSIS — F419 Anxiety disorder, unspecified: Secondary | ICD-10-CM

## 2019-11-29 NOTE — Progress Notes (Signed)
I connected with  Patricia Morales on 11/29/19 at 10:15 AM EST by telephone and verified that I am speaking with the correct person using two identifiers.   I discussed the limitations, risks, security and privacy concerns of performing an evaluation and management service by telephone and the availability of in person appointments. I also discussed with the patient that there may be a patient responsible charge related to this service. The patient expressed understanding and agreed to proceed.  Pt is not at home at this time and is unable to check BP. Requested that pt take BP when she is home and either put into Babyscripts or send a MyChart message. Pt denies s/s of htn. Explained it is very important that pt take BP weekly.  Annabell Howells, RN 11/29/2019  10:23 AM

## 2019-11-29 NOTE — Progress Notes (Signed)
TELEHEALTH OBSTETRICS PRENATAL VIRTUAL VIDEO VISIT ENCOUNTER NOTE  Provider location: [facility] [place] [facility] [employer]   I connected with Patricia Morales on 11/29/19 at 10:15 AM EST by WebEx Video Encounter at home and verified that I am speaking with the correct person using two identifiers.   I discussed the limitations, risks, security and privacy concerns of performing an evaluation and management service virtually and the availability of in person appointments. I also discussed with the patient that there may be a patient responsible charge related to this service. The patient expressed understanding and agreed to proceed. Subjective:  Patricia Morales is a 29 y.o. G2P1001 at [redacted]w[redacted]d being seen today for ongoing prenatal care.  She is currently monitored for the following issues for this low-risk pregnancy and has Pap smear of cervix shows high risk HPV present; Supervision of high risk pregnancy, antepartum; Prolactinoma ([place]); Obesity in pregnancy; Anxiety; and Anemia in pregnancy on their problem list.  Patient reports no complaints.  Contractions: Not present. Vag. Bleeding: None.  Movement: Present. Denies any leaking of fluid.   The following portions of the patient's history were reviewed and updated as appropriate: allergies, current medications, past family history, past medical history, past social history, past surgical history and problem list.   Objective:  There were no vitals filed for this visit.  Fetal Status:     Movement: Present     General:  Alert, oriented and cooperative. Patient is in no acute distress.  Respiratory: Normal respiratory effort, no problems with respiration noted  Mental Status: Normal mood and affect. Normal behavior. Normal judgment and thought content.  Rest of physical exam deferred due to type of encounter  Imaging: [place] MFM OB FOLLOW UP  Result Date: 11/20/2019 ----------------------------------------------------------------------   OBSTETRICS REPORT                        (Signed Final 11/20/2019 05:50 am) ---------------------------------------------------------------------- Patient Info  ID #:       [id number]                          D.O.B.:  20-Jun-1991 (28 yrs)  Name:       Patricia Morales                Visit Date: 11/19/2019 02:37 pm ---------------------------------------------------------------------- Performed By  Performed By:     Rodrigo Ran BS      Ref. Address:      [address]                                                              [address]  Attending:        Johnell Comings MD         [facility]  Referred By:      [facility] ---------------------------------------------------------------------- Orders   #  Description                          Code         Ordered By   1  [place] MFM OB FOLLOW UP                  [id number]     RAVI SHANKAR  ----------------------------------------------------------------------   #  Order #                    Accession #                 Episode #   1  [id number]                  [id number]                  [id number]  ---------------------------------------------------------------------- Indications   Medical complication of pregnancy              O26.90   (prolactinoma- followed by endocrinology)   Encounter for other antenatal screening        Z36.2   follow-up   Subchorionic hemorrhage, antepartum            99991111   Obesity complicating pregnancy, second         O99.212   trimester   [redacted] weeks gestation of pregnancy                Z3A.27  ---------------------------------------------------------------------- Fetal Evaluation  Num Of Fetuses:          1  Fetal Heart Rate(bpm):   158  Cardiac Activity:        Observed  Presentation:            Cephalic  Placenta:                Anterior  P. Cord Insertion:       Previously Visualized  Amniotic Fluid  AFI FV:      Within  normal limits                              Largest Pocket(cm)                              5 ---------------------------------------------------------------------- Biometry  BPD:      68.7  mm     G. Age:  27w 4d         55  %    CI:        73.05   %    70 - 86                                                          FL/HC:       19.8  %    18.6 - 20.4  HC:      255.5  mm     G. Age:  27w 5d         41  %    HC/AC:  1.12       1.05 - 1.21  AC:       229   mm     G. Age:  27w 2d         46  %    FL/BPD:      73.8  %    71 - 87  FL:       50.7  mm     G. Age:  27w 1d         36  %    FL/AC:       22.1  %    20 - 24  HUM:      50.4  mm     G. Age:  29w 4d       > 95  %  Est. FW:    1057   gm     2 lb 5 oz     44  % ---------------------------------------------------------------------- OB History  Gravidity:    2         Term:   1        Prem:   0        SAB:   0  TOP:          0       Ectopic:  0        Living: 1 ---------------------------------------------------------------------- Gestational Age  LMP:           28w 0d        Date:  05/07/19                 EDD:   02/11/20  U/S Today:     27w 3d                                        EDD:   02/15/20  Best:          27w 1d     Det. By:  Loman Chroman         EDD:   02/17/20                                      (06/22/19) ---------------------------------------------------------------------- Anatomy  Cranium:               Appears normal         Aortic Arch:            Previously seen  Cavum:                 Appears normal         Ductal Arch:            Previously seen  Ventricles:            Appears normal         Diaphragm:              Appears normal  Choroid Plexus:        Appears normal         Stomach:                Appears normal, left  sided  Cerebellum:            Appears normal         Abdomen:                Appears normal  Posterior Fossa:       Appears normal         Abdominal  Wall:         Previously seen  Nuchal Fold:           Previously seen        Cord Vessels:           Appears normal (3                                                                        vessel cord)  Face:                  Appears normal         Kidneys:                Appear normal                         (orbits and profile)  Lips:                  Appears normal         Bladder:                Appears normal  Thoracic:              Appears normal         Spine:                  Previously seen  Heart:                 Previously seen        Upper Extremities:      Previously seen  RVOT:                  Appears normal         Lower Extremities:      Previously seen  LVOT:                  Appears normal  Other:  Heels and 5th digit previously visualized. Nasal bone visualized. ---------------------------------------------------------------------- Cervix Uterus Adnexa  Cervix  Not visualized (advanced GA >24wks)  Uterus  No abnormality visualized.  Left Ovary  Not visualized.  Right Ovary  Within normal limits.  Cul De Sac  No free fluid seen.  Adnexa  No abnormality visualized. ---------------------------------------------------------------------- Comments  This patient was seen for a follow up growth scan due to a  history of a prolactinoma. She denies any problems since her  last exam.  She was informed that the fetal growth and amniotic fluid  level appears appropriate for her gestational age.  A follow up exam was scheduled in 6 weeks. ----------------------------------------------------------------------                   Johnell Comings, MD Electronically Signed Final Report   11/20/2019 05:50 am ----------------------------------------------------------------------   Assessment and Plan:  Pregnancy: G2P1001 at [redacted]w[redacted]d 1. Anemia during pregnancy in third trimester  Hgb 10.2, continue Iron. Ok to break the iron doses up during the week d/t constipation and hemorrhoids.   2. Obesity in  pregnancy    3. Anxiety   4. Supervision of high risk pregnancy, antepartum  Not checking BP at home. Says she will check it today and log into baby scripts.     Preterm labor symptoms and general obstetric precautions including but not limited to vaginal bleeding, contractions, leaking of fluid and fetal movement were reviewed in detail with the patient. I discussed the assessment and treatment plan with the patient. The patient was provided an opportunity to ask questions and all were answered. The patient agreed with the plan and demonstrated an understanding of the instructions. The patient was advised to call back or seek an in-person office evaluation/go to [facility] at [facility] for any urgent or concerning symptoms. Please refer to After Visit Summary for other counseling recommendations.   I provided 10 minutes of face-to-face time during this encounter.  No follow-ups on file.  Future Appointments  Date Time Provider [place]  01/01/2020  8:15 AM [place] NURSE [place] [facility]  01/01/2020  8:15 AM [place], NP [facility], [place]

## 2019-12-17 ENCOUNTER — Other Ambulatory Visit: Payer: Self-pay

## 2019-12-17 ENCOUNTER — Telehealth (INDEPENDENT_AMBULATORY_CARE_PROVIDER_SITE_OTHER): Payer: Medicaid Other | Admitting: Advanced Practice Midwife

## 2019-12-17 ENCOUNTER — Other Ambulatory Visit: Payer: Medicaid Other

## 2019-12-17 VITALS — BP 118/72 | HR 102

## 2019-12-17 DIAGNOSIS — O0993 Supervision of high risk pregnancy, unspecified, third trimester: Secondary | ICD-10-CM

## 2019-12-17 DIAGNOSIS — D352 Benign neoplasm of pituitary gland: Secondary | ICD-10-CM

## 2019-12-17 DIAGNOSIS — L299 Pruritus, unspecified: Secondary | ICD-10-CM

## 2019-12-17 DIAGNOSIS — O26893 Other specified pregnancy related conditions, third trimester: Secondary | ICD-10-CM | POA: Diagnosis not present

## 2019-12-17 DIAGNOSIS — Z3A31 31 weeks gestation of pregnancy: Secondary | ICD-10-CM

## 2019-12-17 DIAGNOSIS — O0990 Supervision of high risk pregnancy, unspecified, unspecified trimester: Secondary | ICD-10-CM

## 2019-12-17 NOTE — Patient Instructions (Signed)
Fetal Movement Counts Patient Name: ________________________________________________ Patient Due Date: ____________________ What is a fetal movement count?  A fetal movement count is the number of times that you feel your baby move during a certain amount of time. This may also be called a fetal kick count. A fetal movement count is recommended for every pregnant woman. You may be asked to start counting fetal movements as early as week 28 of your pregnancy. Pay attention to when your baby is most active. You may notice your baby's sleep and wake cycles. You may also notice things that make your baby move more. You should do a fetal movement count:  When your baby is normally most active.  At the same time each day. A good time to count movements is while you are resting, after having something to eat and drink. How do I count fetal movements? 1. Find a quiet, comfortable area. Sit, or lie down on your side. 2. Write down the date, the start time and stop time, and the number of movements that you felt between those two times. Take this information with you to your health care visits. 3. Write down your start time when you feel the first movement. 4. Count kicks, flutters, swishes, rolls, and jabs. You should feel at least 10 movements. 5. You may stop counting after you have felt 10 movements, or if you have been counting for 2 hours. Write down the stop time. 6. If you do not feel 10 movements in 2 hours, contact your health care provider for further instructions. Your health care provider may want to do additional tests to assess your baby's well-being. Contact a health care provider if:  You feel fewer than 10 movements in 2 hours.  Your baby is not moving like he or she usually does. Date: ____________ Start time: ____________ Stop time: ____________ Movements: ____________ Date: ____________ Start time: ____________ Stop time: ____________ Movements: ____________ Date: ____________  Start time: ____________ Stop time: ____________ Movements: ____________ Date: ____________ Start time: ____________ Stop time: ____________ Movements: ____________ Date: ____________ Start time: ____________ Stop time: ____________ Movements: ____________ Date: ____________ Start time: ____________ Stop time: ____________ Movements: ____________ Date: ____________ Start time: ____________ Stop time: ____________ Movements: ____________ Date: ____________ Start time: ____________ Stop time: ____________ Movements: ____________ Date: ____________ Start time: ____________ Stop time: ____________ Movements: ____________ This information is not intended to replace advice given to you by your health care provider. Make sure you discuss any questions you have with your health care provider. Document Revised: 05/24/2019 Document Reviewed: 05/24/2019 Elsevier Patient Education  2020 Elsevier Inc.  

## 2019-12-17 NOTE — Progress Notes (Signed)
I connected with  Patricia Morales on 12/17/19 at 10:35 AM EST by telephone and verified that I am speaking with the correct person using two identifiers.   I discussed the limitations, risks, security and privacy concerns of performing an evaluation and management service by telephone and the availability of in person appointments. I also discussed with the patient that there may be a patient responsible charge related to this service. The patient expressed understanding and agreed to proceed.  [name], CMA 12/17/2019  10:01 AM

## 2019-12-17 NOTE — Progress Notes (Signed)
   TELEHEALTH VIRTUAL OBSTETRICS VISIT ENCOUNTER NOTE  I connected with Patricia Morales on 12/17/19 at 10:35 AM EST by telephone at home and verified that I am speaking with the correct person using two identifiers.   I discussed the limitations, risks, security and privacy concerns of performing an evaluation and management service by telephone and the availability of in person appointments. I also discussed with the patient that there may be a patient responsible charge related to this service. The patient expressed understanding and agreed to proceed.  Subjective:  Patricia Morales is a 29 y.o. G2P1001 at [redacted]w[redacted]d being followed for ongoing prenatal care.  She is currently monitored for the following issues for this high-risk pregnancy and has Pap smear of cervix shows high risk HPV present; Supervision of high risk pregnancy, antepartum; Prolactinoma ([place]); Obesity in pregnancy; Anxiety; and Anemia in pregnancy on their problem list.  Patient reports no complaints today. She endorses chest pain, foot swelling and itching on her hands and feet. All symptoms occurred around the same time then resolved spontaneously a few days ago. Reports fetal movement. Denies any contractions, bleeding or leaking of fluid.   The following portions of the patient's history were reviewed and updated as appropriate: allergies, current medications, past family history, past medical history, past social history, past surgical history and problem list.   Objective:   General:  Alert, oriented and cooperative.   Mental Status: Normal mood and affect perceived. Normal judgment and thought content.  Rest of physical exam deferred due to type of encounter  Assessment and Plan:  Pregnancy: G2P1001 at [redacted]w[redacted]d 1. Supervision of high risk pregnancy, antepartum - Patient able to take bp for virtual appointment but not taking weekly between visits - Discussed that weekly home monitoring ensures patient is safe for virtual  appointments  2. Itching - Patient to present for lab draw to rule out ICP at her earliest convenience - Bile acids, total; Future - Comprehensive metabolic panel; Future  3. Prolactinoma [facility]) - MFM ongoing surveillance, next appt 01/01/2020  Preterm labor symptoms and general obstetric precautions including but not limited to vaginal bleeding, contractions, leaking of fluid and fetal movement were reviewed in detail with the patient.  I discussed the assessment and treatment plan with the patient. The patient was provided an opportunity to ask questions and all were answered. The patient agreed with the plan and demonstrated an understanding of the instructions. The patient was advised to call back or seek an in-person office evaluation/go to MAU at [facility] for any urgent or concerning symptoms. Please refer to After Visit Summary for other counseling recommendations.   I provided ten minutes of non-face-to-face time during this encounter.  Virtual appointment in two weeks then in-person at 36 weeks for swabs.   Future Appointments  Date Time Provider [place]  12/17/2019 11:00 AM WOC-WOCA LAB WOC-WOCA WOC  01/01/2020  8:15 AM WH-MFC NURSE [place] MFC-US  01/01/2020  8:15 AM [place], [place] for [employer], [place]

## 2019-12-18 LAB — COMPREHENSIVE METABOLIC PANEL
ALT: 16 IU/L (ref 0–32)
AST: 13 IU/L (ref 0–40)
Albumin/Globulin Ratio: 1.2 (ref 1.2–2.2)
Albumin: 3.6 g/dL — ABNORMAL LOW (ref 3.9–5.0)
Alkaline Phosphatase: 61 IU/L (ref 39–117)
BUN/Creatinine Ratio: 11 (ref 9–23)
BUN: 6 mg/dL (ref 6–20)
Bilirubin Total: <0.2 mg/dL (ref 0.0–1.2)
CO2: 19 mmol/L — ABNORMAL LOW (ref 20–29)
Calcium: 9.6 mg/dL (ref 8.7–10.2)
Chloride: 101 mmol/L (ref 96–106)
Creatinine, Ser: 0.55 mg/dL — ABNORMAL LOW (ref 0.57–1.00)
GFR calc Af Amer: 146 mL/min/{1.73_m2} (ref >59)
GFR calc non Af Amer: 126 mL/min/{1.73_m2} (ref >59)
Globulin, Total: 3 g/dL (ref 1.5–4.5)
Glucose: 89 mg/dL (ref 65–99)
Potassium: 3.8 mmol/L (ref 3.5–5.2)
Sodium: 135 mmol/L (ref 134–144)
Total Protein: 6.6 g/dL (ref 6.0–8.5)

## 2019-12-18 LAB — BILE ACIDS, TOTAL: Bile Acids Total: 2 umol/L (ref 0.0–10.0)

## 2019-12-28 ENCOUNTER — Telehealth (INDEPENDENT_AMBULATORY_CARE_PROVIDER_SITE_OTHER): Payer: Medicaid Other | Admitting: Obstetrics and Gynecology

## 2019-12-28 VITALS — BP 139/73 | HR 107

## 2019-12-28 DIAGNOSIS — Z3A32 32 weeks gestation of pregnancy: Secondary | ICD-10-CM

## 2019-12-28 DIAGNOSIS — F419 Anxiety disorder, unspecified: Secondary | ICD-10-CM | POA: Diagnosis not present

## 2019-12-28 DIAGNOSIS — O99343 Other mental disorders complicating pregnancy, third trimester: Secondary | ICD-10-CM | POA: Diagnosis not present

## 2019-12-28 DIAGNOSIS — O99210 Obesity complicating pregnancy, unspecified trimester: Secondary | ICD-10-CM

## 2019-12-28 DIAGNOSIS — O99013 Anemia complicating pregnancy, third trimester: Secondary | ICD-10-CM

## 2019-12-28 DIAGNOSIS — O0990 Supervision of high risk pregnancy, unspecified, unspecified trimester: Secondary | ICD-10-CM

## 2019-12-28 NOTE — Progress Notes (Signed)
   TELEHEALTH VIRTUAL OBSTETRICS VISIT ENCOUNTER NOTE  Clinic: [facility]  I connected with Patricia Morales on 12/28/19 at 10:55 AM EST by telephone at home and verified that I am speaking with the correct person using two identifiers.   I discussed the limitations, risks, security and privacy concerns of performing an evaluation and management service by telephone and the availability of in person appointments. I also discussed with the patient that there may be a patient responsible charge related to this service. The patient expressed understanding and agreed to proceed.  Subjective:  Patricia Morales is a 29 y.o. G2P1001 at [redacted]w[redacted]d being followed for ongoing prenatal care.  She is currently monitored for the following issues for this low-risk pregnancy and has Pap smear of cervix shows high risk HPV present; Supervision of high risk pregnancy, antepartum; Prolactinoma ([place]); Obesity in pregnancy; Anxiety; and Anemia in pregnancy on their problem list.  Patient reports no complaints. Reports fetal movement. Denies any contractions, bleeding or leaking of fluid.   The following portions of the patient's history were reviewed and updated as appropriate: allergies, current medications, past family history, past medical history, past social history, past surgical history and problem list.   Objective:   Vitals:   12/28/19 1032  BP: 139/73  Pulse: (!) 107    Babyscripts Data Reviewed: not applicable  General:  Alert, oriented and cooperative.   Mental Status: Normal mood and affect perceived. Normal judgment and thought content.  Rest of physical exam deferred due to type of encounter  Assessment and Plan:  Pregnancy: G2P1001 at [redacted]w[redacted]d 1. Supervision of high risk pregnancy, antepartum Routine care. Follow up any neuro, visual field s/s for prolactinoma. F/u surveillance u/s next week  Preterm labor symptoms and general obstetric precautions including but not  limited to vaginal bleeding, contractions, leaking of fluid and fetal movement were reviewed in detail with the patient.  I discussed the assessment and treatment plan with the patient. The patient was provided an opportunity to ask questions and all were answered. The patient agreed with the plan and demonstrated an understanding of the instructions. The patient was advised to call back or seek an in-person office evaluation/go to MAU at [facility] for any urgent or concerning symptoms. Please refer to After Visit Summary for other counseling recommendations.   I provided 7 minutes of non-face-to-face time during this encounter. The visit was conducted via MyChart-medicine  Return in about 2 weeks (around 01/11/2020) for virtual visit, low risk.  Future Appointments  Date Time Provider [place]  12/28/2019 10:55 AM Aletha Halim, MD [place]  01/01/2020  8:15 AM WH-MFC NURSE [place] MFC-US  01/01/2020  8:15 AM [place] 4 WH-MFCUS MFC-US    [place] for [facility], [place]

## 2019-12-28 NOTE — Progress Notes (Signed)
I connected with  Patricia Morales on 12/28/19 at 10:55 AM EST by telephone and verified that I am speaking with the correct person using two identifiers.   I discussed the limitations, risks, security and privacy concerns of performing an evaluation and management service by telephone and the availability of in person appointments. I also discussed with the patient that there may be a patient responsible charge related to this service. The patient expressed understanding and agreed to proceed.  [place], [place] 12/28/2019  10:31 AM

## 2019-12-31 ENCOUNTER — Telehealth: Payer: Medicaid Other | Admitting: Obstetrics and Gynecology

## 2020-01-01 ENCOUNTER — Ambulatory Visit (HOSPITAL_COMMUNITY): Payer: Medicaid Other | Admitting: *Deleted

## 2020-01-01 ENCOUNTER — Ambulatory Visit (HOSPITAL_COMMUNITY)
Admission: RE | Admit: 2020-01-01 | Discharge: 2020-01-01 | Disposition: A | Discharge status: Home / Self Care | Payer: Medicaid Other | Source: Ambulatory Visit | Attending: Obstetrics and Gynecology | Admitting: Obstetrics and Gynecology

## 2020-01-01 ENCOUNTER — Other Ambulatory Visit: Payer: Self-pay

## 2020-01-01 ENCOUNTER — Encounter (HOSPITAL_COMMUNITY): Payer: Self-pay

## 2020-01-01 DIAGNOSIS — Z3A33 33 weeks gestation of pregnancy: Secondary | ICD-10-CM

## 2020-01-01 DIAGNOSIS — O99210 Obesity complicating pregnancy, unspecified trimester: Secondary | ICD-10-CM

## 2020-01-01 DIAGNOSIS — O4590 Premature separation of placenta, unspecified, unspecified trimester: Secondary | ICD-10-CM

## 2020-01-01 DIAGNOSIS — D352 Benign neoplasm of pituitary gland: Secondary | ICD-10-CM | POA: Diagnosis present

## 2020-01-01 DIAGNOSIS — O2690 Pregnancy related conditions, unspecified, unspecified trimester: Secondary | ICD-10-CM

## 2020-01-01 DIAGNOSIS — F419 Anxiety disorder, unspecified: Secondary | ICD-10-CM

## 2020-01-01 DIAGNOSIS — Z362 Encounter for other antenatal screening follow-up: Secondary | ICD-10-CM

## 2020-01-01 DIAGNOSIS — O99212 Obesity complicating pregnancy, second trimester: Secondary | ICD-10-CM

## 2020-01-10 ENCOUNTER — Emergency Department (HOSPITAL_COMMUNITY)
Admission: EM | Admit: 2020-01-10 | Discharge: 2020-01-10 | Disposition: A | Discharge status: Home / Self Care | Payer: Medicaid Other | Attending: Emergency Medicine | Admitting: Emergency Medicine

## 2020-01-10 ENCOUNTER — Encounter (HOSPITAL_COMMUNITY): Payer: Self-pay

## 2020-01-10 ENCOUNTER — Emergency Department (HOSPITAL_COMMUNITY): Payer: Medicaid Other

## 2020-01-10 DIAGNOSIS — Y9301 Activity, walking, marching and hiking: Secondary | ICD-10-CM | POA: Diagnosis not present

## 2020-01-10 DIAGNOSIS — Y999 Unspecified external cause status: Secondary | ICD-10-CM | POA: Diagnosis not present

## 2020-01-10 DIAGNOSIS — M79631 Pain in right forearm: Secondary | ICD-10-CM | POA: Diagnosis not present

## 2020-01-10 DIAGNOSIS — W010XXA Fall on same level from slipping, tripping and stumbling without subsequent striking against object, initial encounter: Secondary | ICD-10-CM | POA: Diagnosis not present

## 2020-01-10 DIAGNOSIS — Z3A32 32 weeks gestation of pregnancy: Secondary | ICD-10-CM | POA: Diagnosis not present

## 2020-01-10 DIAGNOSIS — Z87891 Personal history of nicotine dependence: Secondary | ICD-10-CM | POA: Insufficient documentation

## 2020-01-10 DIAGNOSIS — R1084 Generalized abdominal pain: Secondary | ICD-10-CM | POA: Insufficient documentation

## 2020-01-10 DIAGNOSIS — Y92511 Restaurant or cafe as the place of occurrence of the external cause: Secondary | ICD-10-CM | POA: Insufficient documentation

## 2020-01-10 DIAGNOSIS — R109 Unspecified abdominal pain: Secondary | ICD-10-CM

## 2020-01-10 DIAGNOSIS — W19XXXA Unspecified fall, initial encounter: Secondary | ICD-10-CM

## 2020-01-10 DIAGNOSIS — M79601 Pain in right arm: Secondary | ICD-10-CM

## 2020-01-10 NOTE — Progress Notes (Signed)
Spoke with Dr. Hulan Fray about vaginal exam and reassuring fetal heart monitoring. Patient is cleared from [facility] standpoint at this time.   Dr. Laverta [place] (EDP) made aware.

## 2020-01-10 NOTE — ED Triage Notes (Signed)
Pt states she fell around 2050 straight back onto her back and right arm. Pt is 43mo pregnant and since the fall pt has not been able to feel the baby move. Pt reports that the baby is usually pretty active. Pt states that she is now having cramps in her belly button, states that it does not feel like a contraction. Fetal doppler used in triage, HR of 136. Pt has pain in her right arm and right lower back. This is pts second child.

## 2020-01-10 NOTE — Progress Notes (Signed)
Patient reports falling earlier tonight landing on her RIGHT arm and back. Pt also reports decreased fetal movement since fall. Denies abdominal pain, leaking fluids, or vaginal bleeding.

## 2020-01-10 NOTE — ED Notes (Signed)
Rapid OB at bedside 

## 2020-01-10 NOTE — ED Provider Notes (Signed)
Emergency Department Provider Note   I have reviewed the triage vital signs and the nursing notes.   HISTORY  Chief Complaint Fall   HPI Patricia Morales is a 29 y.o. female G2, P1 currently 8 months pregnant presents to the emergency department after fall.  Patient states that she was at a restaurant with her daughter when she slipped and fell backwards landing on her back and trying to catch herself with her right arm.  She has right forearm pain and some mild lower back discomfort.  She developed a tightness in her abdomen which feels like the center of her belly and is nonradiating.  This has been constant since the fall.  She denies any vaginal bleeding, rush of fluid, or pelvic pressure.  She states this does not feel like contractions from her first pregnancy.  Her pregnancy thus far has been uncomplicated.  She is [facility] the [facility] center [facility].   Past Medical History:  Diagnosis Date  . Anxiety   . Headache   . Infection    UTI  . Prolactinoma ([place])   . Vaginal Pap smear, abnormal    colpo, in process of eval    Patient Active Problem List   Diagnosis Date Noted  . Anemia in pregnancy 08/29/2019  . Obesity in pregnancy 08/13/2019  . Anxiety   . Supervision of high risk pregnancy, antepartum 07/23/2019  . Prolactinoma ([place]) 07/23/2019  . 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 NO PAST SURGERIES      Allergies Patient has no known allergies.  Family History  Problem Relation Age of Onset  . Vision loss Mother   . Multiple sclerosis Mother   . Hearing loss Maternal Uncle   . Diabetes Maternal Grandmother   . Cancer Paternal Grandmother   . Thyroid disease Sister     Social History Social History   Tobacco Use  . Smoking status: Former Smoker    Packs/day: 0.50    Types: Cigarettes  . Smokeless tobacco: Never Used  . Tobacco comment: quit with +preg test    Substance Use Topics  . Alcohol use: Not Currently    Comment: twice a month  . Drug use: No    Review of Systems  Constitutional: No fever/chills Eyes: No visual changes. ENT: No sore throat. Cardiovascular: Denies chest pain. Respiratory: Denies shortness of breath. Gastrointestinal: Positive abdominal pain.  No nausea, no vomiting.  No diarrhea.  No constipation. Genitourinary: Negative for dysuria. Musculoskeletal: Positive for back pain. Positive right arm pain.  Skin: Negative for rash. Neurological: Negative for headaches, focal weakness or numbness.  10-point ROS otherwise negative.  ____________________________________________   PHYSICAL EXAM:  VITAL SIGNS: ED Triage Vitals  Enc Vitals Group     BP 01/10/20 2119 133/84     Pulse Rate 01/10/20 2119 95     Resp 01/10/20 2119 16     Temp 01/10/20 2119 98.8 F (37.1 C)     Temp Source 01/10/20 2119 Oral     SpO2 01/10/20 2119 97 %     Weight 01/10/20 2127 205 lb (93 kg)     Height 01/10/20 2127 5\' 4"  (1.626 m)   Constitutional: Alert and oriented. Well appearing and in no acute distress. Eyes: Conjunctivae are normal. Head: Atraumatic. Nose: No congestion/rhinnorhea. Mouth/Throat: Mucous membranes are moist.  Neck: No stridor.   Cardiovascular: Normal rate, regular rhythm. Good peripheral circulation.  Grossly normal heart sounds.   Respiratory: Normal respiratory effort.  No retractions. Lungs CTAB. Gastrointestinal: Soft and nontender. Abdomen is gravid.  Musculoskeletal: No lower extremity tenderness nor edema. No gross deformities of extremities.  No forearm deformity or bruising.  Mild tenderness over the mid right forearm.  No wrist or elbow tenderness.  Full range of motion of all extremities.  Neurologic:  Normal speech and language. No gross focal neurologic deficits are appreciated.  Skin:  Skin is warm, dry and intact. No rash  noted.   ____________________________________________  RADIOLOGY  Plain film of right forearm reviewed.  ____________________________________________   PROCEDURES  Procedure(s) performed:   Procedures  None ____________________________________________   INITIAL IMPRESSION / ASSESSMENT AND PLAN / ED COURSE  Pertinent labs & imaging results that were available during my care of the patient were reviewed by me and considered in my medical decision making (see chart for details).   Patient presents to the emergency department for evaluation of right arm pain after fall.  She is having some abdominal tightness and is 8 months pregnant.  Plan for fetal monitoring and TOCO.  Will discuss with GYN after interpretation of the fetal heart tracing.  Plain films reviewed. No acute findings. Cat I tracing per Gyn after review. Patient cleared from their perspective. Will discharge with strict ED return precautions discussed and OB follow up plan in plan.  ____________________________________________  FINAL CLINICAL IMPRESSION(S) / ED DIAGNOSES  Final diagnoses:  Fall, initial encounter  Right arm pain  Abdominal cramping    Note:  This document was prepared using Dragon voice recognition software and may include unintentional dictation errors.  Nanda Quinton, MD, [facility] Emergency Medicine    Binyomin Brann, [place], MD 01/13/20 1754

## 2020-01-10 NOTE — Discharge Instructions (Signed)
He was seen in the emergency department today after fall.  Your x-ray is normal.  Your baby is looking healthy here.  Please keep your follow-up appointment with your OB/GYN and return to the emergency department any new or suddenly worsening symptoms.

## 2020-01-14 ENCOUNTER — Telehealth (INDEPENDENT_AMBULATORY_CARE_PROVIDER_SITE_OTHER): Payer: Medicaid Other | Admitting: Family Medicine

## 2020-01-14 VITALS — BP 127/73 | HR 111

## 2020-01-14 DIAGNOSIS — O99013 Anemia complicating pregnancy, third trimester: Secondary | ICD-10-CM | POA: Diagnosis not present

## 2020-01-14 DIAGNOSIS — F419 Anxiety disorder, unspecified: Secondary | ICD-10-CM

## 2020-01-14 DIAGNOSIS — O0990 Supervision of high risk pregnancy, unspecified, unspecified trimester: Secondary | ICD-10-CM

## 2020-01-14 DIAGNOSIS — O99343 Other mental disorders complicating pregnancy, third trimester: Secondary | ICD-10-CM | POA: Diagnosis not present

## 2020-01-14 DIAGNOSIS — O99210 Obesity complicating pregnancy, unspecified trimester: Secondary | ICD-10-CM

## 2020-01-14 DIAGNOSIS — Z3A35 35 weeks gestation of pregnancy: Secondary | ICD-10-CM

## 2020-01-14 NOTE — Progress Notes (Signed)
OBSTETRICS PRENATAL VIRTUAL VISIT ENCOUNTER NOTE  Provider location: [facility] [place] [facility] [place]   I connected with Patricia Morales on 01/14/20 at  3:55 PM EDT by MyChart Video Encounter at home and verified that I am speaking with the correct person using two identifiers.   I discussed the limitations, risks, security and privacy concerns of performing an evaluation and management service virtually and the availability of in person appointments. I also discussed with the patient that there may be a patient responsible charge related to this service. The patient expressed understanding and agreed to proceed. Subjective:  Patricia Morales is a 29 y.o. G2P1001 at [redacted]w[redacted]d being seen today for ongoing prenatal care.  She is currently monitored for the following issues for this high-risk pregnancy and has Pap smear of cervix shows high risk HPV present; Supervision of high risk pregnancy, antepartum; Prolactinoma ([place]); Obesity in pregnancy; Anxiety; and Anemia in pregnancy on their problem list.  Patient reports backache.  Contractions: Irritability. Vag. Bleeding: None.  Movement: Present. Denies any leaking of fluid.   The following portions of the patient's history were reviewed and updated as appropriate: allergies, current medications, past family history, past medical history, past social history, past surgical history and problem list.   Objective:   Vitals:   01/14/20 1552  BP: 127/73  Pulse: (!) 111    Fetal Status:     Movement: Present     General:  Alert, oriented and cooperative. Patient is in no acute distress.  Respiratory: Normal respiratory effort, no problems with respiration noted  Mental Status: Normal mood and affect. Normal behavior. Normal judgment and thought content.  Rest of physical exam deferred due to type of encounter  Imaging: DG Forearm Right  Result Date: 01/10/2020 CLINICAL DATA:  29 year old female in the 3rd trimester of pregnancy status  post fall with pain. EXAM: RIGHT FOREARM - 2 VIEW COMPARISON:  None. FINDINGS: Bone mineralization is within normal limits. No right elbow joint effusion is evident. Joint spaces and alignment appear preserved. There is no evidence of fracture or other focal bone lesions. There is mild dorsal soft tissue swelling at the mid forearm overlying the ulna. No radiopaque foreign body identified. No other discrete soft tissue injury. IMPRESSION: Mild dorsal soft tissue swelling. No osseous abnormality identified. Electronically Signed   By: Genevie Ann M.D.   On: 01/10/2020 22:14   [place] MFM OB FOLLOW UP  Result Date: 01/01/2020 ----------------------------------------------------------------------  OBSTETRICS REPORT                       (Signed Final 01/01/2020 10:05 am) ---------------------------------------------------------------------- Patient Info  ID #:       [id number]                          D.O.B.:  [birth year] (29 yrs)  Name:       Patricia Morales                Visit Date: 01/01/2020 08:22 am ---------------------------------------------------------------------- Performed By  Performed By:     Georgie Chard        Ref. Address:     [address]  [address]  Attending:        Tama High MD        [facility]  Referred By:      [facility] ---------------------------------------------------------------------- Orders   #  Description                          Code         Ordered By   1  [place] MFM OB FOLLOW UP                  [phone]     YU FANG  ----------------------------------------------------------------------   #  Order #                    Accession #                 Episode #   1  [id number]                  [id number]                  [id number]  ---------------------------------------------------------------------- Indications    Encounter for other antenatal screening        Z36.2   follow-up   Medical complication of pregnancy              O26.90   (prolactinoma- followed by endocrinology)   Subchorionic hemorrhage, antepartum            99991111   Obesity complicating pregnancy, second         O99.212   trimester   [redacted] weeks gestation of pregnancy                Z3A.33  ---------------------------------------------------------------------- Fetal Evaluation  Num Of Fetuses:         1  Fetal Heart Rate(bpm):  149  Cardiac Activity:       Observed  Presentation:           Cephalic  Placenta:               Anterior  P. Cord Insertion:      Visualized  Amniotic Fluid  AFI FV:      Within normal limits  AFI Sum(cm)     %Tile       Largest Pocket(cm)  22.29           85          6.57  RUQ(cm)       RLQ(cm)       LUQ(cm)        LLQ(cm)  6.57          3.78          6.22           5.72 ---------------------------------------------------------------------- Biometry  BPD:      82.8  mm     G. Age:  33w 2d         45  %    CI:  76.69   %    70 - 86                                                          FL/HC:      21.7   %    19.9 - 21.5  HC:      299.5  mm     G. Age:  33w 1d         14  %    HC/AC:      1.03        0.96 - 1.11  AC:      290.9  mm     G. Age:  33w 1d         46  %    FL/BPD:     78.4   %    71 - 87  FL:       64.9  mm     G. Age:  33w 3d         14  %    FL/AC:      22.3   %    20 - 24  HUM:      59.7  mm     G. Age:  34w 5d         85  %  LV:        3.6  mm  Est. FW:    2149  gm    4 lb 12 oz      40  % ---------------------------------------------------------------------- OB History  Gravidity:    2         Term:   1        Prem:   0        SAB:   0  TOP:          0       Ectopic:  0        Living: 1 ---------------------------------------------------------------------- Gestational Age  LMP:           34w 1d        Date:  05/07/19                 EDD:   02/11/20  U/S Today:     33w 2d                                         EDD:   02/17/20  Best:          33w 2d     Det. ByLoman Chroman         EDD:   02/17/20                                      (06/22/19) ---------------------------------------------------------------------- Anatomy  Cranium:               Appears normal         Aortic Arch:            Previously seen  Cavum:  Appears normal         Ductal Arch:            Previously seen  Ventricles:            Appears normal         Diaphragm:              Appears normal  Choroid Plexus:        Previously seen        Stomach:                Appears normal, left                                                                        sided  Cerebellum:            Previously seen        Abdomen:                Appears normal  Posterior Fossa:       Previously seen        Abdominal Wall:         Previously seen  Nuchal Fold:           Not applicable ([id number]    Cord Vessels:           Previously seen                         wks GA)  Face:                  Orbits and profile     Kidneys:                Appear normal                         previously seen  Lips:                  Previously seen        Bladder:                Appears normal  Thoracic:              Appears normal         Spine:                  Previously seen  Heart:                 Previously seen        Upper Extremities:      Previously seen  RVOT:                  Previously seen        Lower Extremities:      Previously seen  LVOT:                  Previously seen ---------------------------------------------------------------------- Impression  Patient returns for fetal growth assessment.  She has a  history of prolactinoma without symptoms of headache or  visual disturbances.  She is not taking cabergoline in this  pregnancy.  Patient does not have gestational diabetes.  Fetal growth is appropriate for gestational age.  Amniotic fluid  is normal and good fetal activity seen.  We reassured the patient of the findings.  ---------------------------------------------------------------------- Recommendations  Follow-up scans as clinically indicated. ----------------------------------------------------------------------                  Tama High, MD Electronically Signed Final Report   01/01/2020 10:05 am ----------------------------------------------------------------------   Assessment and Plan:  Pregnancy: G2P1001 at [redacted]w[redacted]d 1. Anemia during pregnancy in third trimester Iron supplements  2. Obesity in pregnancy   3. Anxiety  4. Supervision of high risk pregnancy, antepartum Good fetal movement. Occasional ctx. Back pain related to her fall.  Preterm labor symptoms and general obstetric precautions including but not limited to vaginal bleeding, contractions, leaking of fluid and fetal movement were reviewed in detail with the patient. I discussed the assessment and treatment plan with the patient. The patient was provided an opportunity to ask questions and all were answered. The patient agreed with the plan and demonstrated an understanding of the instructions. The patient was advised to call back or seek an in-person office evaluation/go to MAU at [facility] for any urgent or concerning symptoms. Please refer to After Visit Summary for other counseling recommendations.   I provided 11 minutes of face-to-face time during this encounter.  Return in about 1 week (around 01/21/2020) for HR OB f/u, GBS, In Office.  Future Appointments  Date Time Provider [place]  01/21/2020  1:55 PM Virginia Rochester, NP [place] for [employer], [place]

## 2020-01-14 NOTE — Progress Notes (Signed)
I connected with  Sharanya Orians on 01/14/20 at  3:55 PM EDT by telephone and verified that I am speaking with the correct person using two identifiers.   I discussed the limitations, risks, security and privacy concerns of performing an evaluation and management service by telephone and the availability of in person appointments. I also discussed with the patient that there may be a patient responsible charge related to this service. The patient expressed understanding and agreed to proceed.  Alric Seton, [place] 01/14/2020  3:51 PM

## 2020-01-21 ENCOUNTER — Other Ambulatory Visit: Payer: Self-pay

## 2020-01-21 ENCOUNTER — Other Ambulatory Visit (HOSPITAL_COMMUNITY)
Admission: RE | Admit: 2020-01-21 | Discharge: 2020-01-21 | Disposition: A | Discharge status: Home / Self Care | Payer: Medicaid Other | Source: Ambulatory Visit | Attending: Nurse Practitioner | Admitting: Nurse Practitioner

## 2020-01-21 ENCOUNTER — Ambulatory Visit (INDEPENDENT_AMBULATORY_CARE_PROVIDER_SITE_OTHER): Payer: Medicaid Other | Admitting: Nurse Practitioner

## 2020-01-21 VITALS — BP 115/66 | HR 95 | Temp 98.2°F

## 2020-01-21 DIAGNOSIS — O99013 Anemia complicating pregnancy, third trimester: Secondary | ICD-10-CM

## 2020-01-21 DIAGNOSIS — O0990 Supervision of high risk pregnancy, unspecified, unspecified trimester: Secondary | ICD-10-CM | POA: Diagnosis not present

## 2020-01-21 DIAGNOSIS — Z3A36 36 weeks gestation of pregnancy: Secondary | ICD-10-CM

## 2020-01-21 NOTE — Progress Notes (Signed)
    Subjective:  Patricia Morales is a 29 y.o. G2P1001 at [redacted]w[redacted]d being seen today for ongoing prenatal care.  She is currently monitored for the following issues for this high-risk pregnancy and has Pap smear of cervix shows high risk HPV present; Supervision of high risk pregnancy, antepartum; Prolactinoma ([place]); Obesity in pregnancy; Anxiety; and Anemia in pregnancy on their problem list.  Patient reports no complaints.  Contractions: Irregular. Vag. Bleeding: None.  Movement: Present. Denies leaking of fluid.   The following portions of the patient's history were reviewed and updated as appropriate: allergies, current medications, past family history, past medical history, past social history, past surgical history and problem list. Problem list updated.  Objective:   Vitals:   01/21/20 1427  BP: 115/66  Pulse: 95  Temp: 98.2 F (36.8 C)    Fetal Status: Fetal Heart Rate (bpm): 135 Fundal Height: 37 cm Movement: Present     General:  Alert, oriented and cooperative. Patient is in no acute distress.  Skin: Skin is warm and dry. No rash noted.   Cardiovascular: Normal heart rate noted  Respiratory: Normal respiratory effort, no problems with respiration noted  Abdomen: Soft, gravid, appropriate for gestational age. Pain/Pressure: Present     Pelvic:  Cervical exam performed      Vaginal swabs done  Cervix closed.  Baby high.  Extremities: Normal range of motion.  Edema: Trace  Mental Status: Normal mood and affect. Normal behavior. Normal judgment and thought content.   Urinalysis:      Assessment and Plan:  Pregnancy: G2P1001 at [redacted]w[redacted]d  1. Supervision of high risk pregnancy, antepartum Baby moving well  - Culture, beta strep (group b only) - GC/Chlamydia Probe Amp  2. Anemia during pregnancy in third trimester Taking iron a few times a week = not daily  Preterm labor symptoms and general obstetric precautions including but not limited to vaginal bleeding, contractions,  leaking of fluid and fetal movement were reviewed in detail with the patient. Please refer to After Visit Summary for other counseling recommendations.  Return in about 1 week (around 01/28/2020) for virtual ROB.  Earlie Server, RN, MSN, NP-BC Nurse Practitioner, [facility] for [employer], [place] [facility] 01/21/2020 2:55 PM

## 2020-01-23 LAB — GC/CHLAMYDIA PROBE AMP (~~LOC~~) NOT AT ARMC
Chlamydia: NEGATIVE
Comment: NEGATIVE
Comment: NORMAL
Neisseria Gonorrhea: NEGATIVE

## 2020-01-25 ENCOUNTER — Encounter: Payer: Self-pay | Admitting: Nurse Practitioner

## 2020-01-25 DIAGNOSIS — O99820 Streptococcus B carrier state complicating pregnancy: Secondary | ICD-10-CM | POA: Insufficient documentation

## 2020-01-25 LAB — CULTURE, BETA STREP (GROUP B ONLY): Strep Gp B Culture: POSITIVE — AB

## 2020-01-29 ENCOUNTER — Telehealth (INDEPENDENT_AMBULATORY_CARE_PROVIDER_SITE_OTHER): Payer: Medicaid Other | Admitting: Obstetrics and Gynecology

## 2020-01-29 DIAGNOSIS — O0990 Supervision of high risk pregnancy, unspecified, unspecified trimester: Secondary | ICD-10-CM

## 2020-01-29 NOTE — Progress Notes (Signed)
@  355 LVM calling for appointment, advised to be avaliable by phone I will be giving her a call back. 425pm no answer advised to call to reschedule I have reviewed the chart and agree with nursing staff's documentation of this patient's encounter.  Laury Deep, CNM 01/31/2020 8:44 AM

## 2020-01-29 NOTE — Progress Notes (Signed)
Called pt at 1608; VM left stating I am calling to check pt in for virtual appt. Explained that since this is our second attempt to contact the patient she will need to call the office to reschedule. Callback number given.   Apolonio Schneiders RN 01/29/20 I have reviewed the chart and agree with nursing staff's documentation of this patient's encounter.  Laury Deep, CNM 01/31/2020 8:44 AM

## 2020-02-06 ENCOUNTER — Encounter: Payer: Self-pay | Admitting: Family Medicine

## 2020-02-06 ENCOUNTER — Telehealth (INDEPENDENT_AMBULATORY_CARE_PROVIDER_SITE_OTHER): Payer: Medicaid Other | Admitting: Family Medicine

## 2020-02-06 VITALS — BP 127/75 | HR 86

## 2020-02-06 DIAGNOSIS — O99343 Other mental disorders complicating pregnancy, third trimester: Secondary | ICD-10-CM

## 2020-02-06 DIAGNOSIS — F419 Anxiety disorder, unspecified: Secondary | ICD-10-CM | POA: Diagnosis not present

## 2020-02-06 DIAGNOSIS — R87810 Cervical high risk human papillomavirus (HPV) DNA test positive: Secondary | ICD-10-CM

## 2020-02-06 DIAGNOSIS — O99210 Obesity complicating pregnancy, unspecified trimester: Secondary | ICD-10-CM | POA: Diagnosis not present

## 2020-02-06 DIAGNOSIS — O99820 Streptococcus B carrier state complicating pregnancy: Secondary | ICD-10-CM

## 2020-02-06 DIAGNOSIS — Z3A38 38 weeks gestation of pregnancy: Secondary | ICD-10-CM

## 2020-02-06 DIAGNOSIS — O98313 Other infections with a predominantly sexual mode of transmission complicating pregnancy, third trimester: Secondary | ICD-10-CM

## 2020-02-06 DIAGNOSIS — O99013 Anemia complicating pregnancy, third trimester: Secondary | ICD-10-CM

## 2020-02-06 DIAGNOSIS — O0990 Supervision of high risk pregnancy, unspecified, unspecified trimester: Secondary | ICD-10-CM

## 2020-02-06 NOTE — Progress Notes (Signed)
I connected with  Patricia Morales on 02/06/20 at  4:15 PM EDT by telephone and verified that I am speaking with the correct person using two identifiers.   I discussed the limitations, risks, security and privacy concerns of performing an evaluation and management service by telephone and the availability of in person appointments. I also discussed with the patient that there may be a patient responsible charge related to this service. The patient expressed understanding and agreed to proceed.  [name], CMA 02/06/2020  4:13 PM

## 2020-02-06 NOTE — Patient Instructions (Signed)

## 2020-02-06 NOTE — Progress Notes (Signed)
   TELEHEALTH VIRTUAL OBSTETRICS VISIT ENCOUNTER NOTE  I connected with Patricia Morales on 02/06/20 at  4:15 PM EDT by telephone at home and verified that I am speaking with the correct person using two identifiers.   I discussed the limitations, risks, security and privacy concerns of performing an evaluation and management service by telephone and the availability of in person appointments. I also discussed with the patient that there may be a patient responsible charge related to this service. The patient expressed understanding and agreed to proceed.  Subjective:  Patricia Morales is a 29 y.o. G2P1001 at [redacted]w[redacted]d being followed for ongoing prenatal care.  She is currently monitored for the following issues for this low-risk pregnancy and has Pap smear of cervix shows high risk HPV present; Supervision of high risk pregnancy, antepartum; Prolactinoma ([place]); Obesity in pregnancy; Anxiety; Anemia in pregnancy; and Group B Streptococcus carrier, +RV culture, currently pregnant on their problem list.  Patient reports pelvic pressure. Reports fetal movement. Denies any contractions, bleeding or leaking of fluid.   The following portions of the patient's history were reviewed and updated as appropriate: allergies, current medications, past family history, past medical history, past social history, past surgical history and problem list.   Objective:   General:  Alert, oriented and cooperative.   Mental Status: Normal mood and affect perceived. Normal judgment and thought content.  Rest of physical exam deferred due to type of encounter  Assessment and Plan:  Pregnancy: G2P1001 at [redacted]w[redacted]d  1. Anemia during pregnancy in third trimester - Hgb 10.2 on 11/21/19  2. Obesity in pregnancy  3. Anxiety - Stable  4. Supervision of high risk pregnancy, antepartum - Continue routine prenatal care - [place] circuit reviewed, as well as natural ways to induce labor  5. Group B Streptococcus carrier, +RV  culture, currently pregnant - Discussed with patient - Will treat in labor  6. Pap smear of cervix shows high risk HPV present - repeat Pap 08/2020  Term labor symptoms and general obstetric precautions including but not limited to vaginal bleeding, contractions, leaking of fluid and fetal movement were reviewed in detail with the patient.  I discussed the assessment and treatment plan with the patient. The patient was provided an opportunity to ask questions and all were answered. The patient agreed with the plan and demonstrated an understanding of the instructions. The patient was advised to call back or seek an in-person office evaluation/go to MAU at [facility] for any urgent or concerning symptoms. Please refer to After Visit Summary for other counseling recommendations.   I provided 7 minutes of non-face-to-face time during this encounter.  Return for 1 week, would like in person to have membranes stripped.  Future Appointments  Date Time Provider [place]  02/13/2020  4:15 PM Tresea Mall, CNM [facility] [place] for [employer], [place]

## 2020-02-08 ENCOUNTER — Other Ambulatory Visit: Payer: Self-pay

## 2020-02-08 ENCOUNTER — Emergency Department (HOSPITAL_COMMUNITY)
Admission: EM | Admit: 2020-02-08 | Discharge: 2020-02-08 | Disposition: A | Discharge status: Home / Self Care | Payer: Medicaid Other | Attending: Emergency Medicine | Admitting: Emergency Medicine

## 2020-02-08 ENCOUNTER — Encounter (HOSPITAL_COMMUNITY): Payer: Self-pay

## 2020-02-08 DIAGNOSIS — K6289 Other specified diseases of anus and rectum: Secondary | ICD-10-CM

## 2020-02-08 DIAGNOSIS — Z87891 Personal history of nicotine dependence: Secondary | ICD-10-CM | POA: Diagnosis not present

## 2020-02-08 DIAGNOSIS — K648 Other hemorrhoids: Secondary | ICD-10-CM | POA: Diagnosis present

## 2020-02-08 DIAGNOSIS — Z79899 Other long term (current) drug therapy: Secondary | ICD-10-CM | POA: Insufficient documentation

## 2020-02-08 MED ORDER — HYDROCORTISONE ACETATE 25 MG RE SUPP: 25.0000 mg | Freq: Two times a day (BID) | RECTAL | 0 refills | Status: DC

## 2020-02-08 NOTE — ED Provider Notes (Signed)
[facility] EMERGENCY DEPARTMENT Provider Note   CSN: [id number] Arrival date & time: 02/08/20  3009     History Chief Complaint  Patient presents with  . Hemorrhoids    Patricia Morales is a 29 y.o. female.  The history is provided by the patient, a parent and medical records. No language interpreter was used.  Rectal Bleeding Quality:  Bright red Amount:  Scant Duration:  1 week Timing:  Rare Chronicity:  New Context: hemorrhoids   Similar prior episodes: yes   Relieved by:  Nothing Worsened by:  Nothing Ineffective treatments:  Sitz baths, time and hemorrhoid cream Associated symptoms: no abdominal pain, no dizziness, no fever, no hematemesis, no light-headedness, no loss of consciousness, no recent illness and no vomiting        Past Medical History:  Diagnosis Date  . Anxiety   . Headache   . Infection    UTI  . Prolactinoma ([place])   . Vaginal Pap smear, abnormal    colpo, in process of eval    Patient Active Problem List   Diagnosis Date Noted  . Group B Streptococcus carrier, +RV culture, currently pregnant 01/25/2020  . Anemia in pregnancy 08/29/2019  . Obesity in pregnancy 08/13/2019  . Anxiety   . Supervision of high risk pregnancy, antepartum 07/23/2019  . Prolactinoma ([place]) 07/23/2019  . 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 NO PAST SURGERIES       OB History    Gravida  2   Para  1   Term  1   Preterm      AB      Living  1     SAB      TAB      Ectopic      Multiple      Live Births  1           Family History  Problem Relation Age of Onset  . Vision loss Mother   . Multiple sclerosis Mother   . Hearing loss Maternal Uncle   . Diabetes Maternal Grandmother   . Cancer Paternal Grandmother   . Thyroid disease Sister     Social History   Tobacco Use  . Smoking status: Former Smoker    Packs/day: 0.50    Types:  Cigarettes  . Smokeless tobacco: Never Used  . Tobacco comment: quit with +preg test  Substance Use Topics  . Alcohol use: Not Currently    Comment: twice a month  . Drug use: No    Home Medications Prior to Admission medications   Medication Sig Start Date End Date Taking? Authorizing Provider  Blood Pressure Monitoring (BLOOD PRESSURE KIT) DEVI 1 Device by Does not apply route as needed. 08/13/19   Chancy Milroy, MD  ferrous sulfate 325 (65 FE) MG tablet Take 1 tablet (325 mg total) by mouth daily with breakfast. Patient not taking: Reported on 02/06/2020 11/15/19   Constant, Peggy, MD  Prenatal MV-Min-FA-Omega-3 (PRENATAL GUMMIES/DHA & FA PO) Take 2 tablets by mouth daily.    [provider]    Allergies    Patient has no known allergies.  Review of Systems   Review of Systems  Constitutional: Negative for chills, diaphoresis, fatigue and fever.  HENT: Negative for congestion.   Respiratory: Negative for cough, chest tightness, shortness of breath, wheezing and stridor.   Cardiovascular: Negative for chest pain, palpitations  and leg swelling.  Gastrointestinal: Positive for abdominal distention (gravid), anal bleeding, hematochezia and rectal pain. Negative for abdominal pain, constipation, diarrhea, hematemesis, nausea and vomiting.  Genitourinary: Negative for dysuria.  Musculoskeletal: Negative for back pain.  Neurological: Negative for dizziness, loss of consciousness, light-headedness and headaches.  Psychiatric/Behavioral: Negative for agitation.  All other systems reviewed and are negative.   Physical Exam Updated Vital Signs BP 118/74   Pulse 88   Temp 98.2 F (36.8 C) (Oral)   Resp 18   Ht _0  (1.626 m)   Wt 98 kg   LMP 05/07/2019   SpO2 98%   BMI 37.08 kg/m   Physical Exam Vitals and nursing note reviewed. Exam conducted with a chaperone present.  Constitutional:      General: She is not in acute distress.    Appearance: She is  well-developed. She is not diaphoretic.  HENT:     Head: Normocephalic and atraumatic.     Right Ear: External ear normal.     Left Ear: External ear normal.     Nose: Nose normal. No congestion or rhinorrhea.     Mouth/Throat:     Mouth: Mucous membranes are moist.     Pharynx: No oropharyngeal exudate or posterior oropharyngeal erythema.  Eyes:     Conjunctiva/sclera: Conjunctivae normal.     Pupils: Pupils are equal, round, and reactive to light.  Cardiovascular:     Rate and Rhythm: Normal rate.     Pulses: Normal pulses.     Heart sounds: No murmur.  Pulmonary:     Effort: No respiratory distress.     Breath sounds: No stridor. No wheezing, rhonchi or rales.  Chest:     Chest wall: No tenderness.  Abdominal:     General: Abdomen is flat. There is distension.     Tenderness: There is no abdominal tenderness. There is no right CVA tenderness, left CVA tenderness, guarding or rebound.  Genitourinary:    Rectum: Tenderness and internal hemorrhoid present. No anal fissure or external hemorrhoid. Normal anal tone.     Comments: Nonthrombosed internal hemorrhoid that was easily reduced.  It is tender.  It is pink and well-appearing.  It is not hard or purple.  Exam otherwise unremarkable.  No gross bleeding appreciated. Musculoskeletal:     Cervical back: Normal range of motion and neck supple.  Skin:    General: Skin is warm.     Capillary Refill: Capillary refill takes less than 2 seconds.     Findings: No erythema or rash.  Neurological:     Mental Status: She is alert and oriented to person, place, and time.     Motor: No abnormal muscle tone.     Deep Tendon Reflexes: Reflexes are normal and symmetric.     ED Results / Procedures / Treatments   Labs (all labs ordered are listed, but only abnormal results are displayed) Labs Reviewed - No data to display  EKG None  Radiology No results found.  Procedures Procedures (including critical care time)  Medications  Ordered in ED Medications - No data to display  ED Course  I have reviewed the triage vital signs and the nursing notes.  Pertinent labs & imaging results that were available during my care of the patient were reviewed by me and considered in my medical decision making (see chart for details).    MDM Rules/Calculators/A&P  Taunia Frasco is a 29 y.o. female who is [redacted] weeks pregnant with a history of anxiety and chronic headaches who presents with rectal pain.  Patient reports that she is being diagnosed with internal hemorrhoids and feels that they have been prolapsing causing her rectal pain.  She reports she has had occasional scant bleeding with bowel movements but this is been ongoing for some time.  She reports that over the last several days, her rectum has been hurting in her hemorrhoid has been prolapsing out.  She reports she has not tried to reduce it.  She reports that she tried Preparation H without significant relief and due to the pain came in for evaluation.  She denies any constipation, diarrhea, or urinary symptoms.  She is feeling baby move and reports normal Braxton Hicks contractions.  No new symptoms on that regard today.  No fevers, chills, chest pain, shortness breath, nausea, or vomiting.  She reports her pain is severe.  On exam with a chaperone, patient was found to have a easily reducible pink internal hemorrhoid.  With gentle pressure, it was easily reduced.  It did not appear to be thrombosed.  There was no evidence of purple discoloration and it was not hard.  Otherwise, no rectal bleeding was appreciated.  Abdomen was gravid but otherwise nontender.  Lungs clear and chest nontender.  No back tenderness.  Had a shared decision made conversation with patient and mother.  As it does not appear thrombosed and is not external hemorrhoid, do not suspect she needs a bedside surgery today.  I do suspect that her symptoms are brought on by her term and  close to delivery of pregnancy causing increased pelvic pressure.  We had a long discussion and agree that it is reasonable to try Anusol which the pharmacist confirmed with safe for her at this time.  I do suspect that when she delivers her baby, her symptoms will improve.  If her symptoms do not improve over the next few days with the Anusol, we will give her the number to call to schedule appointment with general surgery as she may need a surgical removal of the internal hemorrhoid.  It was easily reduced today and we feel she is safe for discharge home.  Patient agrees with plan of care with her mother and patient will be discharged for outpatient follow-up.  She understands return precautions and was discharged in good condition.   Final Clinical Impression(s) / ED Diagnoses Final diagnoses:  Internal hemorrhoid  Anal or rectal pain    Rx / DC Orders ED Discharge Orders         Ordered    hydrocortisone (ANUSOL-HC) 25 MG suppository  2 times daily     02/08/20 1323         Clinical Impression: 1. Internal hemorrhoid   2. Anal or rectal pain     Disposition: Discharge  Condition: Good  I have discussed the results, Dx and Tx plan with the pt(& family if present). He/she/they expressed understanding and agree(s) with the plan. Discharge instructions discussed at great length. Strict return precautions discussed and pt &/or family have verbalized understanding of the instructions. No further questions at time of discharge.    Discharge Medication List as of 02/08/2020  1:26 PM    START taking these medications   Details  hydrocortisone (ANUSOL-HC) 25 MG suppository Place 1 suppository (25 mg total) rectally 2 (two) times daily., Starting Fri 02/08/2020, Print  Follow Up: Surgery, [facility] [address] [place] [address] [phone]   General surgery center to schedule an appointment for possible surgery if needed  Vassie Moment, MD [place] [address] [phone]     [place] [address] [id number] mc [place]       Delise Simenson, Gwenyth Allegra, MD 02/08/20 [phone]

## 2020-02-08 NOTE — Discharge Instructions (Signed)
Your history and exam today are consistent with internal hemorrhoid that is prolapsing and causing your discomfort.  On exam, it was not thrombosed therefore, we did not feel that a surgical procedure was needed today.  However, your hemorrhoid was able to be reduced with gentle pressure.  Please try using the Anusol cream twice a day for the next 6 days to see if this helps.  If your symptoms do not begin to improve, please consider either return to the nearest emergency department or following up in the general surgery clinic to discuss surgical management.  Please try and stay hydrated to help prevent hard stools.  Please continue your routine prenatal care and if any symptoms change or worsen acutely, please return to nearest emergency department.

## 2020-02-08 NOTE — ED Notes (Signed)
Patient verbalizes understanding of discharge instructions. Opportunity for questioning and answers were provided. Pt discharged from ED. 

## 2020-02-08 NOTE — ED Triage Notes (Signed)
Pt reports external hemorrhoids, increase pain with walking and sitting, using OTC cream with no relief. Pt is [redacted] weeks pregnant

## 2020-02-10 ENCOUNTER — Inpatient Hospital Stay (HOSPITAL_COMMUNITY)
Admission: EM | Admit: 2020-02-10 | Discharge: 2020-02-10 | Disposition: A | Discharge status: Home / Self Care | Payer: Medicaid Other | Attending: Obstetrics and Gynecology | Admitting: Obstetrics and Gynecology

## 2020-02-10 ENCOUNTER — Other Ambulatory Visit: Payer: Self-pay

## 2020-02-10 ENCOUNTER — Encounter (HOSPITAL_COMMUNITY): Payer: Self-pay | Admitting: Obstetrics and Gynecology

## 2020-02-10 DIAGNOSIS — Z87891 Personal history of nicotine dependence: Secondary | ICD-10-CM | POA: Diagnosis not present

## 2020-02-10 DIAGNOSIS — O2243 Hemorrhoids in pregnancy, third trimester: Secondary | ICD-10-CM | POA: Insufficient documentation

## 2020-02-10 DIAGNOSIS — O471 False labor at or after 37 completed weeks of gestation: Secondary | ICD-10-CM | POA: Insufficient documentation

## 2020-02-10 DIAGNOSIS — O479 False labor, unspecified: Secondary | ICD-10-CM

## 2020-02-10 DIAGNOSIS — Z3A39 39 weeks gestation of pregnancy: Secondary | ICD-10-CM | POA: Diagnosis not present

## 2020-02-10 LAB — URINALYSIS, ROUTINE W REFLEX MICROSCOPIC
Bilirubin Urine: NEGATIVE
Glucose, UA: NEGATIVE mg/dL
Ketones, ur: NEGATIVE mg/dL
Leukocytes,Ua: NEGATIVE
Nitrite: NEGATIVE
Protein, ur: NEGATIVE mg/dL
Specific Gravity, Urine: 1.009 (ref 1.005–1.030)
pH: 7 (ref 5.0–8.0)

## 2020-02-10 MED ORDER — HYDROCORT-PRAMOXINE (PERIANAL) 1-1 % EX FOAM: 1.0000 | Freq: Four times a day (QID) | CUTANEOUS | 0 refills | Status: AC

## 2020-02-10 MED ORDER — HYDROCORT-PRAMOXINE (PERIANAL) 1-1 % EX FOAM
1.0000 | Freq: Once | CUTANEOUS | Status: AC
  Administered 2020-02-10: 15:00:00 1 via RECTAL
  Filled 2020-02-10: qty 10

## 2020-02-10 MED ORDER — WITCH HAZEL-GLYCERIN EX PADS: 1.0000 "application " | MEDICATED_PAD | CUTANEOUS | 12 refills | Status: DC | PRN

## 2020-02-10 NOTE — MAU Note (Signed)
Patricia Morales is a 29 y.o. at [redacted]w[redacted]d here in MAU reporting:  +pelvic pain Constant Onset of complaint: 2am Pain score: 10/10 Has not tried anything for the pain.  +hemorrhoids Patient reports that since the pain started there has been so much pressure that "my hemorrhoid will not go back in. My fiance tried to push it back in but it won't stay. He said it feels hard." Last BM this morning; was  loose. Patient informed RN that she took 2 tbsp of caster oil last night. Vitals:   02/10/20 1251 02/10/20 1348  BP: 120/79 111/67  Pulse: (!) 120 97  Resp: 18 19  Temp: 98.4 F (36.9 C) 98.2 F (36.8 C)  SpO2: 93% 99%    FHT: +FM; 137 Lab orders placed from triage: ua

## 2020-02-10 NOTE — MAU Provider Note (Signed)
History     CSN: [id number]  Arrival date and time: 02/10/20 1326   First Provider Initiated Contact with Patient 02/10/20 1407     Chief Complaint  Patient presents with  . Hemorrhoids  . Pelvic Pain   HPI Patricia Morales is a 29 y.o. G2P1001 at 61w0dwho presents with hemorrhoids and pelvic pain. She states the pelvic pain has been ongoing for weeks but has gotten worse this week. She reports intermittent cramping that she rates a 5/10. She denies any leaking or bleeding. Reports normal fetal movement. She also reports hemorrhoid pain. She was seen in the ED last week for the same complaint but states she is now unable to push the hemorrhoid back in.    OB History    Gravida  2   Para  1   Term  1   Preterm      AB      Living  1     SAB      TAB      Ectopic      Multiple      Live Births  1           Past Medical History:  Diagnosis Date  . Anxiety   . Headache   . Infection    UTI  . Prolactinoma ([place]   . Vaginal Pap smear, abnormal    colpo, in process of eval    Past Surgical History:  Procedure Laterality Date  . NO PAST SURGERIES      Family History  Problem Relation Age of Onset  . Vision loss Mother   . Multiple sclerosis Mother   . Hearing loss Maternal Uncle   . Diabetes Maternal Grandmother   . Cancer Paternal Grandmother   . Thyroid disease Sister     Social History   Tobacco Use  . Smoking status: Former Smoker    Packs/day: 0.50    Types: Cigarettes  . Smokeless tobacco: Never Used  . Tobacco comment: quit with +preg test  Substance Use Topics  . Alcohol use: Not Currently    Comment: twice a month  . Drug use: No    Allergies: No Known Allergies  Medications Prior to Admission  Medication Sig Dispense Refill Last Dose  . Blood Pressure Monitoring (BLOOD PRESSURE KIT) DEVI 1 Device by Does not apply route as needed. 1 Device 0   . ferrous sulfate 325 (65 FE) MG tablet Take 1 tablet (325 mg total) by mouth  daily with breakfast. (Patient not taking: Reported on 02/06/2020) 30 tablet 3   . hydrocortisone (ANUSOL-HC) 25 MG suppository Place 1 suppository (25 mg total) rectally 2 (two) times daily. 12 suppository 0   . Prenatal MV-Min-FA-Omega-3 (PRENATAL GUMMIES/DHA & FA PO) Take 2 tablets by mouth daily.       Review of Systems  Constitutional: Negative.  Negative for fatigue and fever.  HENT: Negative.   Respiratory: Negative.  Negative for shortness of breath.   Cardiovascular: Negative.  Negative for chest pain.  Gastrointestinal: Positive for abdominal pain and rectal pain. Negative for constipation, diarrhea, nausea and vomiting.  Genitourinary: Negative.  Negative for dysuria, vaginal bleeding and vaginal discharge.  Neurological: Negative.  Negative for dizziness and headaches.   Physical Exam   Blood pressure 111/67, pulse 97, temperature 98.2 F (36.8 C), temperature source Oral, resp. rate 19, height '5\' 4"'  (1.626 m), weight 98 kg, last menstrual period 05/07/2019, SpO2 99 %.  Physical Exam  Nursing note and  vitals reviewed. Constitutional: She is oriented to person, place, and time. She appears well-developed and well-nourished. No distress.  HENT:  Head: Normocephalic.  Eyes: Pupils are equal, round, and reactive to light.  Cardiovascular: Normal rate, regular rhythm and normal heart sounds.  Respiratory: Effort normal and breath sounds normal. No respiratory distress.  GI: Soft. Bowel sounds are normal. She exhibits no distension. There is no abdominal tenderness.  Genitourinary: Rectum:     External hemorrhoid (1.5cm, non thrombosed, no discoloration) present.   Neurological: She is alert and oriented to person, place, and time.  Skin: Skin is warm and dry.  Psychiatric: She has a normal mood and affect. Her behavior is normal. Judgment and thought content normal.    Fetal Tracing:  Baseline: 140 Variability: moderate  Accels: 15x15 Decels: none  Toco:  none  Dilation: Fingertip Effacement (%): Thick Cervical Position: Posterior Station: Ballotable Exam by:: Len Blalock CNM  MAU Course  Procedures Results for orders placed or performed during the hospital encounter of 02/10/20 (from the past 24 hour(s))  Urinalysis, Routine w reflex microscopic     Status: Abnormal   Collection Time: 02/10/20  2:03 PM  Result Value Ref Range   Color, Urine YELLOW YELLOW   APPearance HAZY (A) CLEAR   Specific Gravity, Urine 1.009 1.005 - 1.030   pH 7.0 5.0 - 8.0   Glucose, UA NEGATIVE NEGATIVE mg/dL   Hgb urine dipstick SMALL (A) NEGATIVE   Bilirubin Urine NEGATIVE NEGATIVE   Ketones, ur NEGATIVE NEGATIVE mg/dL   Protein, ur NEGATIVE NEGATIVE mg/dL   Nitrite NEGATIVE NEGATIVE   Leukocytes,Ua NEGATIVE NEGATIVE   RBC / HPF 0-5 0 - 5 RBC/hpf   WBC, UA 0-5 0 - 5 WBC/hpf   Bacteria, UA MANY (A) NONE SEEN   Squamous Epithelial / LPF 11-20 0 - 5   Mucus PRESENT    Hyaline Casts, UA PRESENT    MDM UA, UC Proctofoam  Patient requesting IOL because of hemorrhoids. Discussed this was not a reason to electively induce at this time and she could discuss this in the office on Wednesday. Discussed OTC and comfort measures for hemorrhoids and patient reports the medication not being covered at the pharmacy. Encouraged patient to go to [place] instead of [employer] or dollar store to make witch hazel pads. Will print RX so that patient can take to pharmacy of choice. Encouraged patient to use pregnancy support belt to help with symphysis pubis pain.  Assessment and Plan   1. Hemorrhoids during pregnancy in third trimester   2. Braxton Hick's contraction    -Discharge home in stable condition -Rx for proctofoam and tucks pads given to patient -Labor precautions discussed -Patient advised to follow-up with [facility] on Wednesday as scheduled for prenatal care -Patient may return to MAU as needed or if her condition were to change or  worsen  Wende Mott CNM 02/10/2020, 2:08 PM

## 2020-02-10 NOTE — Discharge Instructions (Signed)
Hemorrhoids Hemorrhoids are swollen veins in and around the rectum or anus. There are two types of hemorrhoids:  Internal hemorrhoids. These occur in the veins that are just inside the rectum. They may poke through to the outside and become irritated and painful.  External hemorrhoids. These occur in the veins that are outside the anus and can be felt as a painful swelling or hard lump near the anus. Most hemorrhoids do not cause serious problems, and they can be managed with home treatments such as diet and lifestyle changes. If home treatments do not help the symptoms, procedures can be done to shrink or remove the hemorrhoids. What are the causes? This condition is caused by increased pressure in the anal area. This pressure may result from various things, including:  Constipation.  Straining to have a bowel movement.  Diarrhea.  Pregnancy.  Obesity.  Sitting for long periods of time.  Heavy lifting or other activity that causes you to strain.  Anal sex.  Riding a bike for a long period of time. What are the signs or symptoms? Symptoms of this condition include:  Pain.  Anal itching or irritation.  Rectal bleeding.  Leakage of stool (feces).  Anal swelling.  One or more lumps around the anus. How is this diagnosed? This condition can often be diagnosed through a visual exam. Other exams or tests may also be done, such as:  An exam that involves feeling the rectal area with a gloved hand (digital rectal exam).  An exam of the anal canal that is done using a small tube (anoscope).  A blood test, if you have lost a significant amount of blood.  A test to look inside the colon using a flexible tube with a camera on the end (sigmoidoscopy or colonoscopy). How is this treated? This condition can usually be treated at home. However, various procedures may be done if dietary changes, lifestyle changes, and other home treatments do not help your symptoms. These  procedures can help make the hemorrhoids smaller or remove them completely. Some of these procedures involve surgery, and others do not. Common procedures include:  Rubber band ligation. Rubber bands are placed at the base of the hemorrhoids to cut off their blood supply.  Sclerotherapy. Medicine is injected into the hemorrhoids to shrink them.  Infrared coagulation. A type of light energy is used to get rid of the hemorrhoids.  Hemorrhoidectomy surgery. The hemorrhoids are surgically removed, and the veins that supply them are tied off.  Stapled hemorrhoidopexy surgery. The surgeon staples the base of the hemorrhoid to the rectal wall. Follow these instructions at home: Eating and drinking   Eat foods that have a lot of fiber in them, such as whole grains, beans, nuts, fruits, and vegetables.  Ask your health care provider about taking products that have added fiber (fiber supplements).  Reduce the amount of fat in your diet. You can do this by eating low-fat dairy products, eating less red meat, and avoiding processed foods.  Drink enough fluid to keep your urine pale yellow. Managing pain and swelling   Take warm sitz baths for 20 minutes, 3-4 times a day to ease pain and discomfort. You may do this in a bathtub or using a portable sitz bath that fits over the toilet.  If directed, apply ice to the affected area. Using ice packs between sitz baths may be helpful. ? Put ice in a plastic bag. ? Place a towel between your skin and the bag. ? Leave   the ice on for 20 minutes, 2-3 times a day. General instructions  Take over-the-counter and prescription medicines only as told by your health care provider.  Use medicated creams or suppositories as told.  Get regular exercise. Ask your health care provider how much and what kind of exercise is best for you. In general, you should do moderate exercise for at least 30 minutes on most days of the week (150 minutes each week). This can  include activities such as walking, biking, or yoga.  Go to the bathroom when you have the urge to have a bowel movement. Do not wait.  Avoid straining to have bowel movements.  Keep the anal area dry and clean. Use wet toilet paper or moist towelettes after a bowel movement.  Do not sit on the toilet for long periods of time. This increases blood pooling and pain.  Keep all follow-up visits as told by your health care provider. This is important. Contact a health care provider if you have:  Increasing pain and swelling that are not controlled by treatment or medicine.  Difficulty having a bowel movement, or you are unable to have a bowel movement.  Pain or inflammation outside the area of the hemorrhoids. Get help right away if you have:  Uncontrolled bleeding from your rectum. Summary  Hemorrhoids are swollen veins in and around the rectum or anus.  Most hemorrhoids can be managed with home treatments such as diet and lifestyle changes.  Taking warm sitz baths can help ease pain and discomfort.  In severe cases, procedures or surgery can be done to shrink or remove the hemorrhoids. This information is not intended to replace advice given to you by your health care provider. Make sure you discuss any questions you have with your health care provider. Document Revised: 03/02/2019 Document Reviewed: 02/23/2018 Elsevier Patient Education  2020 Elsevier Inc[employer]   [employer] of the uterus can occur throughout pregnancy, but they are not always a sign that you are in labor. You may have practice contractions called Braxton Hicks contractions. These false labor contractions are sometimes confused with true labor. What are [place] contractions? Braxton Hicks contractions are tightening movements that occur in the muscles of the uterus before labor. Unlike true labor contractions, these contractions do not result in opening (dilation) and thinning of the  cervix. Toward the end of pregnancy (32-34 weeks), Braxton Hicks contractions can happen more often and may become stronger. These contractions are sometimes difficult to tell apart from true labor because they can be very uncomfortable. You should not feel embarrassed if you go to the hospital with false labor. Sometimes, the only way to tell if you are in true labor is for your health care provider to look for changes in the cervix. The health care provider will do a physical exam and may monitor your contractions. If you are not in true labor, the exam should show that your cervix is not dilating and your water has not broken. If there are no other health problems associated with your pregnancy, it is completely safe for you to be sent home with false labor. You may continue to have Braxton Hicks contractions until you go into true labor. How to tell the difference between true labor and false labor True labor  Contractions last 30-70 seconds.  Contractions become very regular.  Discomfort is usually felt in the top of the uterus, and it spreads to the lower abdomen and low back.  Contractions do not  go away with walking.  Contractions usually become more intense and increase in frequency.  The cervix dilates and gets thinner. False labor  Contractions are usually shorter and not as strong as true labor contractions.  Contractions are usually irregular.  Contractions are often felt in the front of the lower abdomen and in the groin.  Contractions may go away when you walk around or change positions while lying down.  Contractions get weaker and are shorter-lasting as time goes on.  The cervix usually does not dilate or become thin. Follow these instructions at home:   Take over-the-counter and prescription medicines only as told by your health care provider.  Keep up with your usual exercises and follow other instructions from your health care provider.  Eat and drink lightly  if you think you are going into labor.  If Braxton Hicks contractions are making you uncomfortable: ? Change your position from lying down or resting to walking, or change from walking to resting. ? Sit and rest in a tub of warm water. ? Drink enough fluid to keep your urine pale yellow. Dehydration may cause these contractions. ? Do slow and deep breathing several times an hour.  Keep all follow-up prenatal visits as told by your health care provider. This is important. Contact a health care provider if:  You have a fever.  You have continuous pain in your abdomen. Get help right away if:  Your contractions become stronger, more regular, and closer together.  You have fluid leaking or gushing from your vagina.  You pass blood-tinged mucus (bloody show).  You have bleeding from your vagina.  You have low back pain that you never had before.  You feel your baby's head pushing down and causing pelvic pressure.  Your baby is not moving inside you as much as it used to. Summary  Contractions that occur before labor are called Braxton Hicks contractions, false labor, or practice contractions.  Braxton Hicks contractions are usually shorter, weaker, farther apart, and less regular than true labor contractions. True labor contractions usually become progressively stronger and regular, and they become more frequent.  Manage discomfort from [facility] contractions by changing position, resting in a warm bath, drinking plenty of water, or practicing deep breathing. This information is not intended to replace advice given to you by your health care provider. Make sure you discuss any questions you have with your health care provider. Document Revised: 09/16/2017 Document Reviewed: 02/17/2017 Elsevier Patient Education  [place].

## 2020-02-11 LAB — CULTURE, OB URINE: Culture: NO GROWTH

## 2020-02-12 ENCOUNTER — Encounter (HOSPITAL_COMMUNITY): Payer: Self-pay | Admitting: Obstetrics & Gynecology

## 2020-02-12 ENCOUNTER — Inpatient Hospital Stay (HOSPITAL_COMMUNITY)
Admission: AD | Admit: 2020-02-12 | Discharge: 2020-02-14 | DRG: 806 | Disposition: A | Discharge status: Home / Self Care | Payer: Medicaid Other | Attending: Family Medicine | Admitting: Family Medicine

## 2020-02-12 ENCOUNTER — Other Ambulatory Visit: Payer: Self-pay

## 2020-02-12 DIAGNOSIS — Z20822 Contact with and (suspected) exposure to covid-19: Secondary | ICD-10-CM | POA: Diagnosis present

## 2020-02-12 DIAGNOSIS — O99214 Obesity complicating childbirth: Secondary | ICD-10-CM | POA: Diagnosis present

## 2020-02-12 DIAGNOSIS — O99013 Anemia complicating pregnancy, third trimester: Secondary | ICD-10-CM

## 2020-02-12 DIAGNOSIS — O4290 Premature rupture of membranes, unspecified as to length of time between rupture and onset of labor, unspecified weeks of gestation: Secondary | ICD-10-CM | POA: Diagnosis present

## 2020-02-12 DIAGNOSIS — Z87891 Personal history of nicotine dependence: Secondary | ICD-10-CM

## 2020-02-12 DIAGNOSIS — Z3A39 39 weeks gestation of pregnancy: Secondary | ICD-10-CM

## 2020-02-12 DIAGNOSIS — O2243 Hemorrhoids in pregnancy, third trimester: Secondary | ICD-10-CM | POA: Diagnosis present

## 2020-02-12 DIAGNOSIS — O99210 Obesity complicating pregnancy, unspecified trimester: Secondary | ICD-10-CM | POA: Diagnosis present

## 2020-02-12 DIAGNOSIS — F419 Anxiety disorder, unspecified: Secondary | ICD-10-CM

## 2020-02-12 DIAGNOSIS — E669 Obesity, unspecified: Secondary | ICD-10-CM | POA: Diagnosis present

## 2020-02-12 DIAGNOSIS — O4292 Full-term premature rupture of membranes, unspecified as to length of time between rupture and onset of labor: Secondary | ICD-10-CM | POA: Diagnosis present

## 2020-02-12 DIAGNOSIS — O99824 Streptococcus B carrier state complicating childbirth: Secondary | ICD-10-CM | POA: Diagnosis present

## 2020-02-12 DIAGNOSIS — O26893 Other specified pregnancy related conditions, third trimester: Secondary | ICD-10-CM | POA: Diagnosis present

## 2020-02-12 DIAGNOSIS — O99820 Streptococcus B carrier state complicating pregnancy: Secondary | ICD-10-CM

## 2020-02-12 LAB — CBC
HCT: 32.2 % — ABNORMAL LOW (ref 36.0–46.0)
Hemoglobin: 11 g/dL — ABNORMAL LOW (ref 12.0–15.0)
MCH: 30.1 pg (ref 26.0–34.0)
MCHC: 34.2 g/dL (ref 30.0–36.0)
MCV: 88 fL (ref 80.0–100.0)
Platelets: 232 10*3/uL (ref 150–400)
RBC: 3.66 MIL/uL — ABNORMAL LOW (ref 3.87–5.11)
RDW: 13.4 % (ref 11.5–15.5)
WBC: 5.3 10*3/uL (ref 4.0–10.5)
nRBC: 0 % (ref 0.0–0.2)

## 2020-02-12 LAB — TYPE AND SCREEN
ABO/RH(D): O POS
Antibody Screen: NEGATIVE

## 2020-02-12 LAB — RESPIRATORY PANEL BY RT PCR (FLU A&B, COVID)
Influenza A by PCR: NEGATIVE
Influenza B by PCR: NEGATIVE
SARS Coronavirus 2 by RT PCR: NEGATIVE

## 2020-02-12 LAB — POCT FERN TEST: POCT Fern Test: NEGATIVE

## 2020-02-12 MED ORDER — OXYTOCIN 40 UNITS IN NORMAL SALINE INFUSION - SIMPLE MED: 2.5000 [IU]/h | INTRAVENOUS | Status: DC

## 2020-02-12 MED ORDER — ONDANSETRON HCL 4 MG/2ML IJ SOLN: 4.0000 mg | Freq: Four times a day (QID) | INTRAMUSCULAR | Status: DC | PRN

## 2020-02-12 MED ORDER — PENICILLIN G POT IN DEXTROSE 60000 UNIT/ML IV SOLN
3.0000 10*6.[IU] | INTRAVENOUS | Status: DC
  Administered 2020-02-12 – 2020-02-13 (×2): 3 10*6.[IU] via INTRAVENOUS
  Filled 2020-02-12 (×2): qty 50

## 2020-02-12 MED ORDER — SOD CITRATE-CITRIC ACID 500-334 MG/5ML PO SOLN: 30.0000 mL | ORAL | Status: DC | PRN

## 2020-02-12 MED ORDER — OXYTOCIN BOLUS FROM INFUSION
500.0000 mL | Freq: Once | INTRAVENOUS | Status: AC
  Administered 2020-02-13: 07:00:00 500 mL via INTRAVENOUS

## 2020-02-12 MED ORDER — TERBUTALINE SULFATE 1 MG/ML IJ SOLN: 0.2500 mg | Freq: Once | INTRAMUSCULAR | Status: DC | PRN

## 2020-02-12 MED ORDER — ACETAMINOPHEN 325 MG PO TABS: 650.0000 mg | ORAL_TABLET | ORAL | Status: DC | PRN

## 2020-02-12 MED ORDER — MISOPROSTOL 50MCG HALF TABLET: 50.0000 ug | ORAL_TABLET | ORAL | Status: DC | PRN

## 2020-02-12 MED ORDER — MISOPROSTOL 50MCG HALF TABLET
ORAL_TABLET | ORAL | Status: AC
  Administered 2020-02-12: 21:00:00 50 ug via BUCCAL
  Filled 2020-02-12: qty 1

## 2020-02-12 MED ORDER — OXYCODONE-ACETAMINOPHEN 5-325 MG PO TABS: 1.0000 | ORAL_TABLET | ORAL | Status: DC | PRN

## 2020-02-12 MED ORDER — SODIUM CHLORIDE 0.9 % IV SOLN
5.0000 10*6.[IU] | Freq: Once | INTRAVENOUS | Status: AC
  Administered 2020-02-12: 19:00:00 5 10*6.[IU] via INTRAVENOUS
  Filled 2020-02-12: qty 5

## 2020-02-12 MED ORDER — LIDOCAINE HCL (PF) 1 % IJ SOLN: 30.0000 mL | INTRAMUSCULAR | Status: DC | PRN

## 2020-02-12 MED ORDER — OXYCODONE-ACETAMINOPHEN 5-325 MG PO TABS: 2.0000 | ORAL_TABLET | ORAL | Status: DC | PRN

## 2020-02-12 MED ORDER — LACTATED RINGERS IV SOLN: INTRAVENOUS | Status: DC

## 2020-02-12 MED ORDER — LACTATED RINGERS IV SOLN
500.0000 mL | INTRAVENOUS | Status: DC | PRN
  Administered 2020-02-13: 06:00:00 500 mL via INTRAVENOUS

## 2020-02-12 NOTE — MAU Provider Note (Signed)
None   S: Ms. Patricia Morales is a 29 y.o. G2P1001 at [redacted]w[redacted]d  who presents to MAU today complaining of leaking of fluid since 1000. She denies vaginal bleeding. She endorses contractions. She reports normal fetal movement.    O: BP 123/69 (BP Location: Right Arm)   Pulse 81   Temp 98.4 F (36.9 C) (Oral)   Resp 18   LMP 05/07/2019   SpO2 99% Comment: room air GENERAL: Well-developed, well-nourished female in no acute distress.  HEAD: Normocephalic, atraumatic.  CHEST: Normal effort of breathing, regular heart rate ABDOMEN: Soft, nontender, gravid PELVIC: Normal external female genitalia. Vagina is pink and rugated. Cervix with normal contour, no lesions. Normal discharge.  + pooling; grossly ruptured.   Cervical exam: Fingertip, thick, posterior.   Fetal Monitoring: Baseline: 140 bpm Variability: Moderate  Accelerations: 15x15 Decelerations: None Contractions: Occasional, irregular pattern   No results found for this or any previous visit (from the past 24 hour(s)).   A: [facility] at [redacted]w[redacted]d  SROM  GBS +  P: Admit  Vertex position Discussed patient with Maryelizabeth Kaufmann CNM   Kaitlynd Phillips, Artist Pais, NP 02/12/2020 2:02 PM     Pt informed that the ultrasound is considered a limited OB ultrasound and is not intended to be a complete ultrasound exam.  Patient also informed that the ultrasound is not being completed with the intent of assessing for fetal or placental anomalies or any pelvic abnormalities.  Explained that the purpose of today's ultrasound is to assess for  presentation.  Patient acknowledges the purpose of the exam and the limitations of the study.    Vertex position  Carless Slatten, Artist Pais, NP  02/12/2020 2:02 PM

## 2020-02-12 NOTE — H&P (Addendum)
OBSTETRIC ADMISSION HISTORY AND PHYSICAL  Patricia Morales is a 29 y.o. female G2P1001 with IUP at 68w2dby early ultrasound presenting for SROM. She reports +FMs, no VB, no blurry vision, headaches or peripheral edema, and RUQ pain.  She plans on breast feeding. She does not request anything for birth control. [facility]   Dating: By [place]--->  Estimated Date of Delivery: 02/17/20  Sono:    '@[redacted]w[redacted]d' , CWD, normal anatomy, cephalic presentation, EFW: 2149gm  (4 lb 12 oz),  40%  Prenatal History/Complications:  Hx of anxiety  Prolactinoma - follows with endocrinology   Past Medical History: Past Medical History:  Diagnosis Date  . Anxiety   . Headache   . Infection    UTI  . Prolactinoma ([facility] [place]   . Vaginal Pap smear, abnormal    colpo, in process of eval    Past Surgical History: Past Surgical History:  Procedure Laterality Date  . NO PAST SURGERIES      Obstetrical History: OB History    Gravida  2   Para  1   Term  1   Preterm      AB      Living  1     SAB      TAB      Ectopic      Multiple      Live Births  1           Social History Social History   Socioeconomic History  . Marital status: Single    Spouse name: Not on file  . Number of children: Not on file  . Years of education: Not on file  . Highest education level: Not on file  Occupational History  . Not on file  Tobacco Use  . Smoking status: Former Smoker    Packs/day: 0.50    Types: Cigarettes  . Smokeless tobacco: Never Used  . Tobacco comment: quit with +preg test  Substance and Sexual Activity  . Alcohol use: Not Currently    Comment: twice a month  . Drug use: No  . Sexual activity: Yes    Birth control/protection: None  Other Topics Concern  . Not on file  Social History Narrative  . Not on file   Social Determinants of Health   Financial Resource Strain:   . Difficulty of Paying Living Expenses:   Food Insecurity: No Food  Insecurity  . Worried About RCharity fundraiserin the Last Year: Never true  . Ran Out of Food in the Last Year: Never true  Transportation Needs: Unmet Transportation Needs  . Lack of Transportation (Medical): Yes  . Lack of Transportation (Non-Medical): No  Physical Activity:   . Days of Exercise per Week:   . Minutes of Exercise per Session:   Stress:   . Feeling of Stress :   Social Connections:   . Frequency of Communication with Friends and Family:   . Frequency of Social Gatherings with Friends and Family:   . Attends Religious Services:   . Active Member of Clubs or Organizations:   . Attends CArchivistMeetings:   .Marland KitchenMarital Status:     Family History: Family History  Problem Relation Age of Onset  . Vision loss Mother   . Multiple sclerosis Mother   . Hearing loss Maternal Uncle   . Diabetes Maternal Grandmother   . Cancer Paternal Grandmother   . Thyroid disease Sister  Allergies: No Known Allergies  Medications Prior to Admission  Medication Sig Dispense Refill Last Dose  . hydrocortisone (ANUSOL-HC) 25 MG suppository Place 1 suppository (25 mg total) rectally 2 (two) times daily. 12 suppository 0 Past Week at Unknown time  . hydrocortisone-pramoxine (PROCTOFOAM-HC) rectal foam Place 1 applicator rectally 4 (four) times daily for 40 doses. 10.9 g 0 Past Week at Unknown time  . Prenatal MV-Min-FA-Omega-3 (PRENATAL GUMMIES/DHA & FA PO) Take 2 tablets by mouth daily.   Past Week at Unknown time  . witch hazel-glycerin (TUCKS) pad Apply 1 application topically as needed for itching. 40 each 12 Past Week at Unknown time  . Blood Pressure Monitoring (BLOOD PRESSURE KIT) DEVI 1 Device by Does not apply route as needed. 1 Device 0   . ferrous sulfate 325 (65 FE) MG tablet Take 1 tablet (325 mg total) by mouth daily with breakfast. (Patient not taking: Reported on 02/06/2020) 30 tablet 3      Review of Systems   All systems reviewed and negative except as  stated in HPI  Blood pressure 114/75, pulse 82, temperature 97.6 F (36.4 C), temperature source Axillary, resp. rate 14, last menstrual period 05/07/2019, SpO2 100 %. General appearance: alert and no distress Lungs: clear to auscultation bilaterally Heart: regular rate and rhythm Abdomen: soft, non-tender; bowel sounds normal Pelvic: gravid uterus  Extremities: Homans sign is negative, no sign of DVT Presentation: cephalic confirmed by bedside ultrasound in the MAU, by NP Fetal monitoringBaseline: 145 bpm, Variability: Good {> 6 bpm), Accelerations: Reactive and Decelerations: Absent Uterine activityFrequency: irregular Dilation: Fingertip Effacement (%): Thick Exam by:: Altamease Oiler, NP   Prenatal labs: ABO, Rh: --/--/O POS (04/27 1440) Antibody: NEG (04/27 1440) Rubella: 1.09 (11/09 1237) RPR: Non Reactive (02/03 0917)  HBsAg: Negative (11/09 1237)  HIV: Non Reactive (02/03 0917)  GBS: Positive/-- (04/05 1439)  2 hr Glucola: 80, 107, 108 Genetic screening: low risk NIPS Anatomy US normal  Prenatal Transfer Tool  Maternal Diabetes: No Genetic Screening: Normal Maternal Ultrasounds/Referrals: Normal Fetal Ultrasounds or other Referrals:  None Maternal Substance Abuse:  No Significant Maternal Medications:  None Significant Maternal Lab Results: Group B Strep positive  Results for orders placed or performed during the hospital encounter of 02/12/20 (from the past 24 hour(s))  CBC   Collection Time: 02/12/20  2:35 PM  Result Value Ref Range   WBC 5.3 4.0 - 10.5 K/uL   RBC 3.66 (L) 3.87 - 5.11 MIL/uL   Hemoglobin 11.0 (L) 12.0 - 15.0 g/dL   HCT 32.2 (L) 36.0 - 46.0 %   MCV 88.0 80.0 - 100.0 fL   MCH 30.1 26.0 - 34.0 pg   MCHC 34.2 30.0 - 36.0 g/dL   RDW 13.4 11.5 - 15.5 %   Platelets 232 150 - 400 K/uL   nRBC 0.0 0.0 - 0.2 %  Type and screen [place]   Collection Time: 02/12/20  2:40 PM  Result Value Ref Range   ABO/RH(D) O POS    Antibody Screen  NEG    Sample Expiration      02/15/2020,2359 Performed at [facility], [place] [address] [place], [place] [address]   Maryann Alar Test   Collection Time: 02/12/20  3:15 PM  Result Value Ref Range   POCT Fern Test Negative = intact amniotic membranes     Patient Active Problem List   Diagnosis Date Noted  . PROM (premature rupture of membranes) 02/12/2020  . Group B Streptococcus carrier, +  RV culture, currently pregnant 01/25/2020  . Anemia in pregnancy 08/29/2019  . Obesity in pregnancy 08/13/2019  . Anxiety   . Supervision of high risk pregnancy, antepartum 07/23/2019  . Prolactinoma ([place]) 07/23/2019  . Pap smear of cervix shows high risk HPV present 08/22/2018    Assessment/Plan:  Mozella Rexrode is a 29 y.o. G2P1001 at 15w2dhere for SROM  #Labor: Admit to L&D. Expectant management. Discussed risks and benefits of labor induction  Patient agreeable to Cytotec. Reassess in 3-4 hours.  #Pain: Per pt request  #FWB: Cat 1 #ID:  GBS positive, PCN ordered #MOF: breast  #MOC: none  #Circ:  NA, girl  VLyndee Hensen DO  02/12/2020, 4:27 PM  Attestation of Supervision of Student:  I confirm that I have verified the information documented in the medical student's note and that I have also personally reperformed the history, physical exam and all medical decision making activities.  I have verified that all services and findings are accurately documented in this student's note; and I agree with management and plan as outlined in the documentation. I have also made any necessary editorial changes.   SDarlina Rumpf CFontanafor WDe[employer]ullosGroup 02/12/2020 5:13 PM

## 2020-02-12 NOTE — Progress Notes (Signed)
Labor Progress Note Shley Pickle is a 29 y.o. G2P1001 at [redacted]w[redacted]d presented for SROM.   S: Patient comfortable but reports hemorrhoidal pain while sitting.   O:  BP 114/75   Pulse 82   Temp 97.6 F (36.4 C) (Axillary)   Resp 14   LMP 05/07/2019   SpO2 100%   EFM:140/mod variability /pos accels, no decels   CVE: Dilation: Fingertip Effacement (%): Thick Cervical Position: Posterior Presentation: Vertex(per bedside ultrasound) Exam by:: J. Rasch, NP   A&P: 29 y.o. G2P1001 [redacted]w[redacted]d here for SROM.  #Labor: Discussed labor induction. Patient agreeable to starting labor induction if she has not changed at recheck.  #Pain: per pt request  #FWB: Cat 1  #GBS positive, PCN     Lyndee Hensen, DO 7:36 PM

## 2020-02-12 NOTE — MAU Note (Signed)
Pt grossly ruptured per provider after sterile spec exam.

## 2020-02-12 NOTE — MAU Note (Signed)
srom at 1000, gush of clear fluid, still coming. Denies bleeding.  irreg contractions, maybe every 65min.  Was 1/2 cm last wk when checked.

## 2020-02-12 NOTE — Progress Notes (Signed)
POC discussed with pt.  Will reevaluate at 8pm.  If no labor, will use cytotec.

## 2020-02-12 NOTE — Progress Notes (Signed)
Labor Progress Note Patricia Morales is a 29 y.o. G2P1001 at [redacted]w[redacted]d presented for SROM  S: Feeling slight contractions.   O:  BP 112/76   Pulse 91   Temp 97.8 F (36.6 C) (Oral)   Resp 14   LMP 05/07/2019   SpO2 100%  EFM: 145 bmp, moderate variability, accels present, no decels R hand: Slight edema compared to left without erythema or noticeable infiltration of IV on R forearm.  CVE: Dilation: Fingertip Effacement (%): Thick Cervical Position: Posterior Presentation: Vertex(per bedside ultrasound) Exam by:: J. Rasch, NP   A&P: 29 y.o. G2P1001 [redacted]w[redacted]d for SOL #Labor: Does not want foley bulb at this time. Would like at least one dose of cytotec before FB. Start cytotec. Will have nurse check cervix after cytotec has had time to take effect, then decide with patient if FB is best option.  #Pain: Epidural when needed #FWB: Cat 1, 40% EFW #GBS positive, PCN  Alroy Bailiff, DO 8:32 PM

## 2020-02-13 ENCOUNTER — Encounter (HOSPITAL_COMMUNITY): Payer: Self-pay | Admitting: Obstetrics & Gynecology

## 2020-02-13 ENCOUNTER — Inpatient Hospital Stay (HOSPITAL_COMMUNITY): Payer: Medicaid Other | Admitting: Anesthesiology

## 2020-02-13 ENCOUNTER — Encounter: Payer: Medicaid Other | Admitting: Advanced Practice Midwife

## 2020-02-13 DIAGNOSIS — Z3A39 39 weeks gestation of pregnancy: Secondary | ICD-10-CM

## 2020-02-13 DIAGNOSIS — O99824 Streptococcus B carrier state complicating childbirth: Secondary | ICD-10-CM

## 2020-02-13 LAB — RPR: RPR Ser Ql: NONREACTIVE

## 2020-02-13 MED ORDER — PHENYLEPHRINE 40 MCG/ML (10ML) SYRINGE FOR IV PUSH (FOR BLOOD PRESSURE SUPPORT)
80.0000 ug | PREFILLED_SYRINGE | INTRAVENOUS | Status: DC | PRN
  Filled 2020-02-13: qty 10

## 2020-02-13 MED ORDER — ZOLPIDEM TARTRATE 5 MG PO TABS: 5.0000 mg | ORAL_TABLET | Freq: Every evening | ORAL | Status: DC | PRN

## 2020-02-13 MED ORDER — LACTATED RINGERS IV SOLN: 500.0000 mL | Freq: Once | INTRAVENOUS | Status: DC

## 2020-02-13 MED ORDER — SIMETHICONE 80 MG PO CHEW: 80.0000 mg | CHEWABLE_TABLET | ORAL | Status: DC | PRN

## 2020-02-13 MED ORDER — ONDANSETRON HCL 4 MG/2ML IJ SOLN: 4.0000 mg | INTRAMUSCULAR | Status: DC | PRN

## 2020-02-13 MED ORDER — WITCH HAZEL-GLYCERIN EX PADS
1.0000 "application " | MEDICATED_PAD | CUTANEOUS | Status: DC | PRN
  Administered 2020-02-13: 1 via TOPICAL

## 2020-02-13 MED ORDER — COCONUT OIL OIL
1.0000 "application " | TOPICAL_OIL | Status: DC | PRN
  Administered 2020-02-14: 1 via TOPICAL

## 2020-02-13 MED ORDER — FENTANYL-BUPIVACAINE-NACL 0.5-0.125-0.9 MG/250ML-% EP SOLN
12.0000 mL/h | EPIDURAL | Status: DC | PRN
  Filled 2020-02-13: qty 250

## 2020-02-13 MED ORDER — PRENATAL MULTIVITAMIN CH
1.0000 | ORAL_TABLET | Freq: Every day | ORAL | Status: DC
  Administered 2020-02-13 – 2020-02-14 (×2): 1 via ORAL
  Filled 2020-02-13 (×2): qty 1

## 2020-02-13 MED ORDER — TETANUS-DIPHTH-ACELL PERTUSSIS 5-2.5-18.5 LF-MCG/0.5 IM SUSP: 0.5000 mL | Freq: Once | INTRAMUSCULAR | Status: DC

## 2020-02-13 MED ORDER — SENNOSIDES-DOCUSATE SODIUM 8.6-50 MG PO TABS
2.0000 | ORAL_TABLET | ORAL | Status: DC
  Administered 2020-02-14: 2 via ORAL
  Filled 2020-02-13: qty 2

## 2020-02-13 MED ORDER — SODIUM CHLORIDE (PF) 0.9 % IJ SOLN
INTRAMUSCULAR | Status: DC | PRN
  Administered 2020-02-13: 12 mL/h via EPIDURAL

## 2020-02-13 MED ORDER — OXYTOCIN 40 UNITS IN NORMAL SALINE INFUSION - SIMPLE MED
1.0000 m[IU]/min | INTRAVENOUS | Status: DC
  Administered 2020-02-13: 02:00:00 2 m[IU]/min via INTRAVENOUS
  Filled 2020-02-13: qty 1000

## 2020-02-13 MED ORDER — DIBUCAINE (PERIANAL) 1 % EX OINT
1.0000 "application " | TOPICAL_OINTMENT | CUTANEOUS | Status: DC | PRN
  Administered 2020-02-13: 1 via RECTAL
  Filled 2020-02-13: qty 28

## 2020-02-13 MED ORDER — PHENYLEPHRINE 40 MCG/ML (10ML) SYRINGE FOR IV PUSH (FOR BLOOD PRESSURE SUPPORT): 80.0000 ug | PREFILLED_SYRINGE | INTRAVENOUS | Status: DC | PRN

## 2020-02-13 MED ORDER — ACETAMINOPHEN 325 MG PO TABS: 650.0000 mg | ORAL_TABLET | ORAL | Status: DC | PRN

## 2020-02-13 MED ORDER — TERBUTALINE SULFATE 1 MG/ML IJ SOLN: 0.2500 mg | Freq: Once | INTRAMUSCULAR | Status: DC | PRN

## 2020-02-13 MED ORDER — LIDOCAINE HCL (PF) 1 % IJ SOLN
INTRAMUSCULAR | Status: DC | PRN
  Administered 2020-02-13: 5 mL via EPIDURAL

## 2020-02-13 MED ORDER — DIPHENHYDRAMINE HCL 50 MG/ML IJ SOLN: 12.5000 mg | INTRAMUSCULAR | Status: DC | PRN

## 2020-02-13 MED ORDER — ONDANSETRON HCL 4 MG PO TABS: 4.0000 mg | ORAL_TABLET | ORAL | Status: DC | PRN

## 2020-02-13 MED ORDER — EPHEDRINE 5 MG/ML INJ: 10.0000 mg | INTRAVENOUS | Status: DC | PRN

## 2020-02-13 MED ORDER — IBUPROFEN 600 MG PO TABS
600.0000 mg | ORAL_TABLET | Freq: Four times a day (QID) | ORAL | Status: DC
  Administered 2020-02-13 – 2020-02-14 (×6): 600 mg via ORAL
  Filled 2020-02-13 (×6): qty 1

## 2020-02-13 MED ORDER — DIPHENHYDRAMINE HCL 25 MG PO CAPS: 25.0000 mg | ORAL_CAPSULE | Freq: Four times a day (QID) | ORAL | Status: DC | PRN

## 2020-02-13 MED ORDER — BENZOCAINE-MENTHOL 20-0.5 % EX AERO
1.0000 "application " | INHALATION_SPRAY | CUTANEOUS | Status: DC | PRN
  Administered 2020-02-13: 1 via TOPICAL
  Filled 2020-02-13: qty 56

## 2020-02-13 NOTE — Lactation Note (Signed)
This note was copied from a baby's chart. Lactation Consultation Note  Patient Name: Patricia Morales [id number] Date: 02/13/2020 Reason for consult: Initial assessment;1st time breastfeeding   P2, Baby 23 hours old.  This is mother's first time breastfeeding. Mother states she will breastfeed and formula feed. Demonstrated how to hand express and mother states her nipples are too sensitive to continue w/ hand expression.  She states she was taught earlier and it hurt.  Assisted w/ latching in football hold. Baby tongue thrusting and pushing off. Baby latched briefly and then became sleepy.  Placed baby STS on mother's chest. At 38 Baby breastfed in football hold for approx 25 min per mother. Reviewed basics.  Feed on demand with cues.  Goal 8-12+ times per day after first 24 hrs.  Place baby STS if not cueing.  Mom made aware of O/P services, breastfeeding support groups, community resources, and our phone # for post-discharge questions.     Maternal Data Has patient been taught Hand Expression?: Yes Does the patient have breastfeeding experience prior to this delivery?: No  Feeding Feeding Type: Breast Fed  LATCH Score Latch: Repeated attempts needed to sustain latch, nipple held in mouth throughout feeding, stimulation needed to elicit sucking reflex.  Audible Swallowing: A few with stimulation  Type of Nipple: Everted at rest and after stimulation  Comfort (Breast/Nipple): Soft / non-tender  Hold (Positioning): Assistance needed to correctly position infant at breast and maintain latch.  LATCH Score: 7  Interventions Interventions: Breast feeding basics reviewed;Assisted with latch;Skin to skin;Hand express;Adjust position;Support pillows  Lactation Tools Discussed/Used     Consult Status Consult Status: Follow-up Date: 02/14/20 Follow-up type: In-patient    Vivianne Master [facility] 02/13/2020, 2:44 PM

## 2020-02-13 NOTE — Discharge Summary (Addendum)
Postpartum Discharge Summary     Patient Name: Patricia Morales DOB: 23-May-1991 MRN: [id number]  Date of admission: 02/12/2020 Delivering Provider: Alroy Bailiff   Date of discharge: 02/14/2020  Admitting diagnosis: PROM (premature rupture of membranes) [O42.90] Intrauterine pregnancy: [redacted]w[redacted]d    Secondary diagnosis:  Active Problems:   Obesity in pregnancy   Group B Streptococcus carrier, +RV culture, currently pregnant   PROM (premature rupture of membranes)  Additional problems: None     Discharge diagnosis: Term Pregnancy Delivered                                                                                                Post partum procedures:None  Augmentation: AROM, Pitocin and Cytotec  Complications: None  Hospital course:  Induction of Labor With Vaginal Delivery   29y.o. yo G2P1001 at 343w3das admitted to the hospital 02/12/2020 for induction of labor.  Indication for induction: PROM.  Patient had an uncomplicated labor course as follows: Initial SVE: 0.5/thick/-3. She was given Cytotex x1, then pitocin started with favorable cervix. Forebag AROM immediately before delivery. She then progressed to complete.  Membrane Rupture Time/Date: 10:00 AM ,02/12/2020   Intrapartum Procedures: Episiotomy: None [1]                                         Lacerations:  None [1]  Patient had delivery of a Viable infant.  Information for the patient's newborn:  AlAriane, Ditullio0[[id number]]Delivery Method: Vaginal, Spontaneous(Filed from Delivery Summary)    02/13/2020  Details of delivery can be found in separate delivery note.  Patient had a routine postpartum course. Patient is discharged home 02/14/20. Delivery time: 6:47 AM    Magnesium Sulfate received: No BMZ received: No Rhophylac:N/A MMR:N/A Transfusion:No  Physical exam  Vitals:   02/13/20 1330 02/13/20 1735 02/13/20 2154 02/14/20 0543  BP: 118/69 113/64 111/70 108/69  Pulse: 84 87 87 80  Resp: '18  16 17 18  ' Temp: 98.9 F (37.2 C) 98.6 F (37 C) 98.2 F (36.8 C) 98 F (36.7 C)  TempSrc: Oral Oral  Oral  SpO2:  99% 100% 97%  Weight:      Height:       General: alert, cooperative and no distress Lochia: appropriate Uterine Fundus: firm Incision: N/A DVT Evaluation: No evidence of DVT seen on physical exam. Labs: Lab Results  Component Value Date   WBC 5.3 02/12/2020   HGB 11.0 (L) 02/12/2020   HCT 32.2 (L) 02/12/2020   MCV 88.0 02/12/2020   PLT 232 02/12/2020   CMP Latest Ref Rng & Units 12/17/2019  Glucose 65 - 99 mg/dL 89  BUN 6 - 20 mg/dL 6  Creatinine 0.57 - 1.00 mg/dL 0.55(L)  Sodium 134 - 144 mmol/L 135  Potassium 3.5 - 5.2 mmol/L 3.8  Chloride 96 - 106 mmol/L 101  CO2 20 - 29 mmol/L 19(L)  Calcium 8.7 - 10.2 mg/dL 9.6  Total Protein 6.0 - 8.5 g/dL 6.6  Total Bilirubin 0.0 - 1.2 mg/dL <0.2  Alkaline Phos 39 - 117 IU/L 61  AST 0 - 40 IU/L 13  ALT 0 - 32 IU/L 16   Edinburgh Score: Edinburgh Postnatal Depression Scale Screening Tool 02/13/2020  I have been able to laugh and see the funny side of things. (No Data)    Discharge instruction: per After Visit Summary and "Baby and Me Booklet".  After visit meds:  Allergies as of 02/14/2020   No Known Allergies     Medication List    STOP taking these medications   Blood Pressure Kit Devi   ferrous sulfate 325 (65 FE) MG tablet     TAKE these medications   hydrocortisone 25 MG suppository Commonly known as: ANUSOL-HC Place 1 suppository (25 mg total) rectally 2 (two) times daily.   hydrocortisone-pramoxine rectal foam Commonly known as: PROCTOFOAM-HC Place 1 applicator rectally 4 (four) times daily for 40 doses.   ibuprofen 600 MG tablet Commonly known as: ADVIL Take 1 tablet (600 mg total) by mouth every 6 (six) hours as needed.   PRENATAL GUMMIES/DHA & FA PO Take 2 tablets by mouth daily.   witch hazel-glycerin pad Commonly known as: TUCKS Apply 1 application topically as needed for  itching.       Diet: routine diet  Activity: Advance as tolerated. Pelvic rest for 6 weeks.   Outpatient follow up:4 weeks (needs repeat Pap Nov 2021 due to +HPV and absent T-zone)  Follow up Appt: Future Appointments  Date Time Provider [place]  03/20/2020  2:15 PM Rasch, Artist Pais, NP [facility]   Follow up Visit:  Please schedule this patient for Postpartum visit in: 4 weeks with the following provider: Any provider Virtual For C/S patients schedule nurse incision check in weeks 2 weeks: no Low risk pregnancy complicated by: none Delivery mode:  SVD Anticipated Birth Control:  other/unsure PP Procedures needed: none  Schedule Integrated BH visit: no   Newborn Data: Live born female  Birth Weight: 3359g APGAR: 60, 9  Newborn Delivery   Birth date/time: 02/13/2020 06:47:00 Delivery type: Vaginal, Spontaneous      Baby Feeding: Breast Disposition:home with mother   02/14/2020 Myrtis Ser, CNM  8:37 AM

## 2020-02-13 NOTE — Anesthesia Preprocedure Evaluation (Addendum)
Anesthesia Evaluation  Patient identified by MRN, date of birth, ID band Patient awake    Reviewed: Allergy & Precautions, NPO status , Patient's Chart, lab work & pertinent test results  Airway Mallampati: II  TM Distance: >3 FB Neck ROM: Full    Dental no notable dental hx. (+) Teeth Intact   Pulmonary neg pulmonary ROS, former smoker,    Pulmonary exam normal breath sounds clear to auscultation       Cardiovascular negative cardio ROS Normal cardiovascular exam Rhythm:Regular Rate:Normal     Neuro/Psych    GI/Hepatic negative GI ROS, Neg liver ROS,   Endo/Other  negative endocrine ROS  Renal/GU negative Renal ROS     Musculoskeletal negative musculoskeletal ROS (+)   Abdominal (+) + obese,   Peds  Hematology Lab Results      Component                Value               Date                      WBC                      5.3                 02/12/2020                HGB                      11.0 (L)            02/12/2020                HCT                      32.2 (L)            02/12/2020                MCV                      88.0                02/12/2020                PLT                      232                 02/12/2020             Anesthesia Other Findings   Reproductive/Obstetrics (+) Pregnancy                             Anesthesia Physical Anesthesia Plan  ASA: III  Anesthesia Plan: Epidural   Post-op Pain Management:    Induction:   PONV Risk Score and Plan:   Airway Management Planned:   Additional Equipment:   Intra-op Plan:   Post-operative Plan:   Informed Consent: I have reviewed the patients History and Physical, chart, labs and discussed the procedure including the risks, benefits and alternatives for the proposed anesthesia with the patient or authorized representative who has indicated his/her understanding and acceptance.       Plan Discussed  with:   Anesthesia Plan Comments: (39.3 Wk  G2P1 for  LEA)        Anesthesia Quick Evaluation

## 2020-02-13 NOTE — Anesthesia Procedure Notes (Signed)
Epidural Patient location during procedure: OB Start time: 02/13/2020 4:39 AM End time: 02/13/2020 4:51 AM  Staffing Anesthesiologist: Barnet Glasgow, MD Performed: anesthesiologist   Preanesthetic Checklist Completed: patient identified, IV checked, site marked, risks and benefits discussed, surgical consent, monitors and equipment checked, pre-op evaluation and timeout performed  Epidural Patient position: sitting Prep: DuraPrep and site prepped and draped Patient monitoring: continuous pulse ox and blood pressure Approach: midline Location: L3-L4 Injection technique: LOR air  Needle:  Needle type: Tuohy  Needle gauge: 17 G Needle length: 9 cm and 9 Needle insertion depth: 7 cm Catheter type: closed end flexible Catheter size: 19 Gauge Catheter at skin depth: 12 cm Test dose: negative  Assessment Events: blood not aspirated, injection not painful, no injection resistance, no paresthesia and negative IV test  Additional Notes Patient identified. Risks/Benefits/Options discussed with patient including but not limited to bleeding, infection, nerve damage, paralysis, failed block, incomplete pain control, headache, blood pressure changes, nausea, vomiting, reactions to medication both or allergic, itching and postpartum back pain. Confirmed with bedside nurse the patient's most recent platelet count. Confirmed with patient that they are not currently taking any anticoagulation, have any bleeding history or any family history of bleeding disorders. Patient expressed understanding and wished to proceed. All questions were answered. Sterile technique was used throughout the entire procedure. Please see nursing notes for vital signs. Test dose was given through epidural needle and negative prior to continuing to dose epidural or start infusion. Warning signs of high block given to the patient including shortness of breath, tingling/numbness in hands, complete motor block, or any  concerning symptoms with instructions to call for help. Patient was given instructions on fall risk and not to get out of bed. All questions and concerns addressed with instructions to call with any issues.  1 Attempt (S) . Patient tolerated procedure well.

## 2020-02-13 NOTE — Anesthesia Postprocedure Evaluation (Signed)
Anesthesia Post Note  Patient: Patricia Morales  Procedure(s) Performed: AN AD [place]     Patient location during evaluation: Mother Baby Anesthesia Type: Epidural Level of consciousness: awake and alert Pain management: pain level controlled Vital Signs Assessment: post-procedure vital signs reviewed and stable Respiratory status: spontaneous breathing, nonlabored ventilation and respiratory function stable Cardiovascular status: stable Postop Assessment: no headache, no backache and epidural receding Anesthetic complications: no    Last Vitals:  Vitals:   02/13/20 1330 02/13/20 1735  BP: 118/69 113/64  Pulse: 84 87  Resp: 18 16  Temp: 37.2 C 37 C  SpO2:  99%    Last Pain:  Vitals:   02/13/20 1745  TempSrc:   PainSc: 5    Pain Goal:                Epidural/Spinal Function Cutaneous sensation: Normal sensation (02/13/20 1735), Patient able to flex knees: Yes (02/13/20 1735), Patient able to lift hips off bed: Yes (02/13/20 1735), Back pain beyond tenderness at insertion site: No (02/13/20 1735), Progressively worsening motor and/or sensory loss: No (02/13/20 1735), Bowel and/or bladder incontinence post epidural: No (02/13/20 1735)  Linna Thebeau

## 2020-02-13 NOTE — Progress Notes (Signed)
Labor Progress Note Patricia Morales is a 29 y.o. G2P1001 at [redacted]w[redacted]d presented for SROM  S: Feeling stronger contractions, in more pain.   O:  BP 115/69   Pulse 90   Temp (!) 97.3 F (36.3 C) (Axillary)   Resp 18   LMP 05/07/2019   SpO2 100%  FHT: 155 bpm, moderate variability, accels, no decels  CVE: Dilation: 4 Effacement (%): 80 Cervical Position: Posterior Station: -2 Presentation: Vertex Exam by:: Rinaldo Cloud, DO   A&P: 30 y.o. G2P1001 [redacted]w[redacted]d here for SROM #Labor: Dilation up to 4cm and 80% effacement after cytotec x 1. Will start with low dose pitocin at this time. Anticipate SVD. #Pain: Epidural when requested #FWB: Cat 1, 40% EFW #GBS positive, PCN  Alroy Bailiff, DO 1:32 AM

## 2020-02-13 NOTE — Progress Notes (Signed)
Labor Progress Note Patricia Morales is a 29 y.o. G2P1001 at [redacted]w[redacted]d presented for SROM  S: s/p epidural, patient sleeping according to nurses.   O:  BP 109/67   Pulse 81   Temp 98.6 F (37 C) (Oral)   Resp 18   LMP 05/07/2019   SpO2 99%  EFM: 145 bmp, marked variability, accels x1, early decel x 1   CVE: Dilation: 6.5 Effacement (%): 80 Cervical Position: Posterior Station: -2 Presentation: Vertex Exam by:: Sheryle Hail, RN   A&P: 29 y.o. G2P1001 [redacted]w[redacted]d presented for SROM #Labor: s/p cytotec x 1, pitocin. Will cont pitocin at this time. Anticipate SVD.  #Pain: s/p epidural #FWB: Cat 1, EFW 40% #GBS positive, PCN  Alroy Bailiff, DO 5:49 AM

## 2020-02-14 MED ORDER — IBUPROFEN 600 MG PO TABS: 600.0000 mg | ORAL_TABLET | Freq: Four times a day (QID) | ORAL | 0 refills | Status: DC | PRN

## 2020-02-14 NOTE — Lactation Note (Signed)
This note was copied from a baby's chart. Lactation Consultation Note  Patient Name: Patricia Morales [id number] Date: 02/14/2020 Reason for consult: Follow-up assessment Baby is 27 hours old/3% weight loss.  Mom reports that feedings are going well.  She is experiencing a little nipple soreness.  Mom will ask her nurse for coconut oil.  Discussed milk coming to volume and the prevention and treatment of engorgement.  Mom has a manual pump at home.  Reviewed outpatient services and encouraged to call prn.  Maternal Data    Feeding    LATCH Score                   Interventions    Lactation Tools Discussed/Used     Consult Status Consult Status: Complete Follow-up type: Call as needed    Ave Filter 02/14/2020, 9:56 AM

## 2020-02-14 NOTE — Progress Notes (Signed)
CSW received consult for hx of Anxiety. CSW met with MOB to offer support and complete assessment.    CSW congratulated MOB on the birth of infant. CSW observed that MOB had a quest in the room, in which MOB reported was her mother. MOB agreeable to [facility] staying in room while CSW spoke with her. CSW advised MOB of CSWs' role and the reason for CSW coming to visit with her. MOB reports that she doesn't have anxiety and reports when she did feel anxiety during her pregnancy it was due to "worry". MOB denies medication management and therapy needs at this time. MOB reports that she isn't feeling SI or HI and reports that she isn't in a DV relationship either.   MOB reports that she has been feeling happy since giving birth and reports that she has no other needs.   CSW provided education regarding the baby blues period vs. perinatal mood disorders, discussed treatment and gave resources for mental health follow up if concerns arise.  CSW recommends self-evaluation during the postpartum time period using the New Mom Checklist from Postpartum Progress and encouraged MOB to contact a medical professional if symptoms are noted at any time.   CSW provided review of Sudden Infant Death Syndrome (SIDS) precautions.   CSW identifies no further need for intervention and no barriers to discharge at this time.    Patricia Morales, MSW, LCSW [facility] [place] [facility] [place] [phone]

## 2020-02-14 NOTE — Discharge Instructions (Signed)

## 2020-03-20 ENCOUNTER — Encounter: Payer: Self-pay | Admitting: Obstetrics and Gynecology

## 2020-03-20 ENCOUNTER — Telehealth (INDEPENDENT_AMBULATORY_CARE_PROVIDER_SITE_OTHER): Payer: Medicaid Other | Admitting: Obstetrics and Gynecology

## 2020-03-20 DIAGNOSIS — O165 Unspecified maternal hypertension, complicating the puerperium: Secondary | ICD-10-CM

## 2020-03-20 DIAGNOSIS — Z1389 Encounter for screening for other disorder: Secondary | ICD-10-CM | POA: Diagnosis not present

## 2020-03-20 MED ORDER — AMLODIPINE BESYLATE 5 MG PO TABS
5.0000 mg | ORAL_TABLET | Freq: Every day | ORAL | 1 refills | Status: DC
Start: 2020-03-20 — End: 2020-04-13

## 2020-03-20 NOTE — Progress Notes (Deleted)
I connected with  Patricia Morales on 03/20/20 at  2:15 PM EDT by telephone and verified that I am speaking with the correct person using two identifiers.   I discussed the limitations, risks, security and privacy concerns of performing an evaluation and management service by telephone and the availability of in person appointments. I also discussed with the patient that there may be a patient responsible charge related to this service. The patient expressed understanding and agreed to proceed.  [name], [name] 03/20/2020  2:31 PM

## 2020-03-20 NOTE — Progress Notes (Signed)
I connected with@ on 03/20/20 at  2:15 PM EDT by: Mychart video and verified that I am speaking with the correct person using two identifiers.  Patient is located at home and provider is located at [employer] [facility] [employer] [facility] [employer] [facility] .     The purpose of this virtual visit is to provide medical care while limiting exposure to the novel coronavirus. I discussed the limitations, risks, security and privacy concerns of performing an evaluation and management service by [facility] and the availability of in person appointments. I also discussed with the patient that there may be a patient responsible charge related to this service. By engaging in this virtual visit, you consent to the provision of healthcare.  Additionally, you authorize for your insurance to be billed for the services provided during this visit.  The patient expressed understanding and agreed to proceed.  The following staff members participated in the virtual visit:  Carver Fila, Noni Saupe, NP   Post Partum Visit Note Subjective:   Patricia Morales is a 29 y.o. [phone] female being evaluated for postpartum followup.  She is 5 weeks postpartum following a normal spontaneous vaginal delivery at  39/3 gestational weeks.  I have fully reviewed the prenatal and intrapartum course; pregnancy complicated by None, Postpartum course has been uncomplicated.  Baby is doing well. Baby is feeding by bottle - [employer] Good Start. Bleeding no bleeding. Bowel function is normal. Bladder function is normal. Patient is not sexually active. Contraception method is none. Postpartum depression screening: negative.  The following portions of the patient's history were reviewed and updated as appropriate: allergies, current medications, past family history, past medical history, past social history, past surgical history and problem list.  Review of Systems Pertinent items are noted in HPI.   Objective:   Vitals:   03/20/20 1434  03/20/20 1438  BP: (!) 158/106 (!) 142/101  Pulse: 80   Self obtained  3rd: 142/101       Assessment:   Normal postpartum exam. Elevated BP @ 5 weeks PP. Rx: Norvasc 5 mg PO daily, likely CHTN Follow up for BP check in 1 week.   Plan:  Essential components of care per ACOG recommendations:  1.  Mood and well being: Patient with negative depression screening today. Reviewed local resources for support.  - Patient does use tobacco.  If using tobacco we discussed reduction and for recently cessation risk of relapse - hx of drug use? No  2. Infant care and feeding:  -Patient currently breastmilk feeding? No   -Social determinants of health (SDOH) reviewed in EPIC. No concerns.   3. Sexuality, contraception and birth spacing - Patient does not want a pregnancy in the next year.  Desired family size is 3 children.  - Reviewed forms of contraception in tiered fashion. Patient desired no method today.   - Discussed birth spacing of 18 months  4. Sleep and fatigue -Encouraged family/partner/community support of 4 hrs of uninterrupted sleep to help with mood and fatigue  5. Physical Recovery  - Discussed patients delivery and complications - Patient had No lacerations, perineal healing reviewed. Patient expressed understanding - Patient has urinary incontinence? No  - Patient is safe to resume physical and sexual activity  6.  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   7. Chronic Disease- HTN - PCP follow up  12 minutes of non-face-to-face time spent with the patient    Noni Saupe, NP  [facility]

## 2020-03-20 NOTE — Progress Notes (Signed)
Pt states does not want to talk about Birth Control.

## 2020-03-27 ENCOUNTER — Ambulatory Visit: Payer: Medicaid Other

## 2020-04-13 ENCOUNTER — Ambulatory Visit (HOSPITAL_COMMUNITY)
Admission: EM | Admit: 2020-04-13 | Discharge: 2020-04-13 | Disposition: A | Discharge status: Home / Self Care | Payer: Medicaid Other | Attending: Urgent Care | Admitting: Urgent Care

## 2020-04-13 ENCOUNTER — Other Ambulatory Visit: Payer: Self-pay

## 2020-04-13 ENCOUNTER — Encounter (HOSPITAL_COMMUNITY): Payer: Self-pay

## 2020-04-13 DIAGNOSIS — J069 Acute upper respiratory infection, unspecified: Secondary | ICD-10-CM | POA: Diagnosis not present

## 2020-04-13 DIAGNOSIS — J029 Acute pharyngitis, unspecified: Secondary | ICD-10-CM | POA: Diagnosis not present

## 2020-04-13 LAB — POCT RAPID STREP A: Streptococcus, Group A Screen (Direct): NEGATIVE

## 2020-04-13 MED ORDER — FLUTICASONE PROPIONATE 50 MCG/ACT NA SUSP: 1.0000 | Freq: Every day | NASAL | 0 refills | Status: DC

## 2020-04-13 MED ORDER — IBUPROFEN 800 MG PO TABS
800.0000 mg | ORAL_TABLET | Freq: Three times a day (TID) | ORAL | 0 refills | Status: DC
Start: 2020-04-13 — End: 2021-03-01

## 2020-04-13 MED ORDER — CETIRIZINE HCL 10 MG PO CAPS
10.0000 mg | ORAL_CAPSULE | Freq: Every day | ORAL | 0 refills | Status: DC
Start: 2020-04-13 — End: 2021-04-23

## 2020-04-13 NOTE — Discharge Instructions (Addendum)

## 2020-04-13 NOTE — ED Provider Notes (Signed)
[place]    CSN: [id number] Arrival date & time: 04/13/20  1722      History   Chief Complaint Chief Complaint  Patient presents with  . Sore Throat    HPI Patricia Morales is a 29 y.o. female 2 months postpartum presenting today for evaluation of sore throat.  Patient reports over the past week she has had a sore throat which has been persistent.  Feels a discomfort in the anterior aspect of her throat.  Denies sensation of swelling.Marland Kitchen  Has had some mild rhinorrhea and some mild cough, but sore throat is most prominent symptoms.  She does report some mucus production that she coughs up.  Concerned about possible strep as she was around somebody with strep, but this was after her symptoms started.  Has been using Tylenol with mild relief of symptoms.  Denies GI symptoms.  Denies fevers.  Denies currently breast-feeding.  HPI  Past Medical History:  Diagnosis Date  . Anxiety   . Headache   . Infection    UTI  . Prolactinoma ([place])   . Vaginal Pap smear, abnormal    colpo, in process of eval    Patient Active Problem List   Diagnosis Date Noted  . PROM (premature rupture of membranes) 02/12/2020  . Group B Streptococcus carrier, +RV culture, currently pregnant 01/25/2020  . Anemia in pregnancy 08/29/2019  . Obesity in pregnancy 08/13/2019  . Anxiety   . Supervision of high risk pregnancy, antepartum 07/23/2019  . Prolactinoma ([place]) 07/23/2019  . 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 NO PAST SURGERIES      OB History    Gravida  2   Para  2   Term  2   Preterm      AB      Living  2     SAB      TAB      Ectopic      Multiple  0   Live Births  2            Home Medications    Prior to Admission medications   Medication Sig Start Date End Date Taking? Authorizing Provider  Cetirizine HCl 10 MG CAPS Take 1 capsule (10 mg total) by mouth daily. 04/13/20    Everhett Bozard C, PA-C  fluticasone (FLONASE) 50 MCG/ACT nasal spray Place 1-2 sprays into both nostrils daily for 7 days. 04/13/20 04/20/20  Daeshaun Specht C, PA-C  ibuprofen (ADVIL) 800 MG tablet Take 1 tablet (800 mg total) by mouth 3 (three) times daily. 04/13/20   Ketara Cavness C, PA-C  amLODipine (NORVASC) 5 MG tablet Take 1 tablet (5 mg total) by mouth daily. 03/20/20 04/13/20  Rasch, Artist Pais, NP    Family History Family History  Problem Relation Age of Onset  . Vision loss Mother   . Multiple sclerosis Mother   . Hearing loss Maternal Uncle   . Diabetes Maternal Grandmother   . Cancer Paternal Grandmother   . Thyroid disease Sister     Social History Social History   Tobacco Use  . Smoking status: Former Smoker    Packs/day: 0.50    Types: Cigarettes  . Smokeless tobacco: Never Used  . Tobacco comment: quit with +preg test  Vaping Use  . Vaping Use: Never used  Substance Use Topics  . Alcohol use: Not Currently    Comment: twice a  month  . Drug use: No     Allergies   Patient has no known allergies.   Review of Systems Review of Systems  Constitutional: Negative for activity change, appetite change, chills, fatigue and fever.  HENT: Positive for rhinorrhea and sore throat. Negative for congestion, ear pain, sinus pressure and trouble swallowing.   Eyes: Negative for discharge and redness.  Respiratory: Positive for cough. Negative for chest tightness and shortness of breath.   Cardiovascular: Negative for chest pain.  Gastrointestinal: Negative for abdominal pain, diarrhea, nausea and vomiting.  Musculoskeletal: Negative for myalgias.  Skin: Negative for rash.  Neurological: Negative for dizziness, light-headedness and headaches.     Physical Exam Triage Vital Signs ED Triage Vitals  Enc Vitals Group     BP 04/13/20 1746 122/83     Pulse Rate 04/13/20 1746 88     Resp 04/13/20 1746 16     Temp 04/13/20 1746 98.2 F (36.8 C)     Temp Source  04/13/20 1746 Oral     SpO2 04/13/20 1746 99 %     Weight --      Height --      Head Circumference --      Peak Flow --      Pain Score 04/13/20 1744 10     Pain Loc --      Pain Edu? --      Excl. in [place]? --    No data found.  Updated Vital Signs BP 122/83 (BP Location: Right Arm)   Pulse 88   Temp 98.2 F (36.8 C) (Oral)   Resp 16   LMP 03/24/2020 (Approximate)   SpO2 99%   Visual Acuity Right Eye Distance:   Left Eye Distance:   Bilateral Distance:    Right Eye Near:   Left Eye Near:    Bilateral Near:     Physical Exam Vitals and nursing note reviewed.  Constitutional:      Appearance: She is well-developed.     Comments: No acute distress  HENT:     Head: Normocephalic and atraumatic.     Ears:     Comments: Bilateral ears without tenderness to palpation of external auricle, tragus and mastoid, EAC's without erythema or swelling, TM's with good bony landmarks and cone of light. Non erythematous.     Nose: Nose normal.     Mouth/Throat:     Comments: Oral mucosa pink and moist, no tonsillar enlargement or exudate. Posterior pharynx patent and nonerythematous, no uvula deviation or swelling. Normal phonation. Eyes:     Conjunctiva/sclera: Conjunctivae normal.  Cardiovascular:     Rate and Rhythm: Normal rate.  Pulmonary:     Effort: Pulmonary effort is normal. No respiratory distress.     Comments: Breathing comfortably at rest, CTABL, no wheezing, rales or other adventitious sounds auscultated Abdominal:     General: There is no distension.  Musculoskeletal:        General: Normal range of motion.     Cervical back: Neck supple.  Skin:    General: Skin is warm and dry.  Neurological:     Mental Status: She is alert and oriented to person, place, and time.      UC Treatments / Results  Labs (all labs ordered are listed, but only abnormal results are displayed) Labs Reviewed  CULTURE, GROUP A STREP [facility])    EKG   Radiology No results  found.  Procedures Procedures (including critical care time)  Medications Ordered in  UC Medications - No data to display  Initial Impression / Assessment and Plan / UC Course  I have reviewed the triage vital signs and the nursing notes.  Pertinent labs & imaging results that were available during my care of the patient were reviewed by me and considered in my medical decision making (see chart for details).     Strep test negative, suspect most likely postnasal drainage/viral etiology of sore throat.  Initiating on daily cetirizine and Flonase, Tessalon for cough.  Rest and push fluids.  Symptomatic and supportive care.  No sign of tonsillitis or peritonsillar abscess at this time.  Discussed strict return precautions. Patient verbalized understanding and is agreeable with plan.  Final Clinical Impressions(s) / UC Diagnoses   Final diagnoses:  Sore throat  Viral URI with cough     Discharge Instructions     Sore Throat  Your rapid strep tested Negative today. We will send for a culture and call in about 2 days if results are positive. For now we will treat your sore throat as a virus with symptom management.   Please continue Tylenol or Ibuprofen for fever and pain. May try salt water gargles, cepacol lozenges, throat spray, or OTC cold relief medicine for throat discomfort. If you also have congestion take a daily anti-histamine like Zyrtec, Claritin, and a oral decongestant to help with post nasal drip that may be irritating your throat.   Stay hydrated and drink plenty of fluids to keep your throat coated relieve irritation.     ED Prescriptions    Medication Sig Dispense Auth. Provider   ibuprofen (ADVIL) 800 MG tablet Take 1 tablet (800 mg total) by mouth 3 (three) times daily. 21 tablet Dayvin Aber C, PA-C   Cetirizine HCl 10 MG CAPS Take 1 capsule (10 mg total) by mouth daily. 15 capsule Hester Joslin C, PA-C   fluticasone (FLONASE) 50 MCG/ACT nasal spray Place  1-2 sprays into both nostrils daily for 7 days. 1 g Roselinda Bahena, [place] C, PA-C     PDMP not reviewed this encounter.   Janith Lima, [place] 04/13/20 1821

## 2020-04-13 NOTE — ED Triage Notes (Signed)
Pt presents today with sore throat x1 week. Pt states swallowing is painful. Pt denies n/v/d runny nose, chest congestion, fever, chills, body aches, head aches, loss of taste or smell. Pt states one of her friends has recently had strep. Pt has been treating with tylenol, last dose 6/26

## 2020-04-15 LAB — CULTURE, GROUP A STREP (THRC)

## 2020-04-24 ENCOUNTER — Other Ambulatory Visit: Payer: Self-pay

## 2020-04-24 ENCOUNTER — Emergency Department (HOSPITAL_COMMUNITY)
Admission: EM | Admit: 2020-04-24 | Discharge: 2020-04-24 | Discharge status: Against Medical Advice | Payer: Medicaid Other

## 2020-04-24 ENCOUNTER — Encounter (HOSPITAL_COMMUNITY): Payer: Self-pay | Admitting: *Deleted

## 2020-04-24 NOTE — ED Triage Notes (Signed)
After triage, the pt states she is leaving and "will come back in the morning"

## 2020-04-24 NOTE — ED Triage Notes (Signed)
Pt came ambulatory to triage stating that she feels like she has a fish bone stuck in her throat since July 4th. She eats and drinks without difficulty. No distress noted.

## 2020-04-26 ENCOUNTER — Ambulatory Visit (HOSPITAL_COMMUNITY)
Admission: EM | Admit: 2020-04-26 | Discharge: 2020-04-26 | Disposition: A | Discharge status: Home / Self Care | Payer: Medicaid Other | Attending: Emergency Medicine | Admitting: Emergency Medicine

## 2020-04-26 ENCOUNTER — Other Ambulatory Visit: Payer: Self-pay

## 2020-04-26 ENCOUNTER — Encounter (HOSPITAL_COMMUNITY): Payer: Self-pay

## 2020-04-26 DIAGNOSIS — H9201 Otalgia, right ear: Secondary | ICD-10-CM

## 2020-04-26 DIAGNOSIS — J029 Acute pharyngitis, unspecified: Secondary | ICD-10-CM

## 2020-04-26 MED ORDER — OMEPRAZOLE 20 MG PO CPDR
20.0000 mg | DELAYED_RELEASE_CAPSULE | Freq: Every day | ORAL | 0 refills | Status: DC
Start: 2020-04-26 — End: 2021-04-23

## 2020-04-26 NOTE — ED Triage Notes (Signed)
Pt present sore throat, with right ear pain.  Symptoms started almost a week ago.

## 2020-04-26 NOTE — Discharge Instructions (Addendum)
Your exam is overall reassuring here today.  I do recommend using the Flonase consistently for at least approximately 2 weeks to see if this is helpful with your symptoms.  Continue with daily cetirizine.  Sometimes reflux can cause a foreign body type sensation and throat irritation so let trial starting omeprazole and taking daily for the next 2 weeks to see if this is helpful.

## 2020-04-26 NOTE — ED Provider Notes (Signed)
[place]    CSN: [id number] Arrival date & time: 04/26/20  1057      History   Chief Complaint Chief Complaint  Patient presents with  . Otalgia  . Sore Throat    HPI Patricia Morales is a 29 y.o. female.   Patricia Morales presents with complaints of sore throat. She has had this for some time. Ate fish on 7/4 and felt like maybe she followed a fish bone. Similar pain 6/27, negative strep at that time. She ate bread and peanut butter and this seemed to help but now with right sided throat pain as well as right ear pain. No fevers. Some cough, no congestion. No known ill contacts. Took flonase once but hasn't taken consistently. Ibuprofen does seem to help with pain.     ROS per HPI, negative if not otherwise mentioned.      Past Medical History:  Diagnosis Date  . Anxiety   . Headache   . Infection    UTI  . Prolactinoma ([place])   . Vaginal Pap smear, abnormal    colpo, in process of eval    Patient Active Problem List   Diagnosis Date Noted  . PROM (premature rupture of membranes) 02/12/2020  . Group B Streptococcus carrier, +RV culture, currently pregnant 01/25/2020  . Anemia in pregnancy 08/29/2019  . Obesity in pregnancy 08/13/2019  . Anxiety   . Supervision of high risk pregnancy, antepartum 07/23/2019  . Prolactinoma ([place]) 07/23/2019  . 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 NO PAST SURGERIES      OB History    Gravida  2   Para  2   Term  2   Preterm      AB      Living  2     SAB      TAB      Ectopic      Multiple  0   Live Births  2            Home Medications    Prior to Admission medications   Medication Sig Start Date End Date Taking? Authorizing Provider  Cetirizine HCl 10 MG CAPS Take 1 capsule (10 mg total) by mouth daily. 04/13/20   Wieters, Hallie C, PA-C  fluticasone (FLONASE) 50 MCG/ACT nasal spray Place 1-2 sprays  into both nostrils daily for 7 days. 04/13/20 04/20/20  Wieters, Hallie C, PA-C  ibuprofen (ADVIL) 800 MG tablet Take 1 tablet (800 mg total) by mouth 3 (three) times daily. 04/13/20   Wieters, Hallie C, PA-C  omeprazole (PRILOSEC) 20 MG capsule Take 1 capsule (20 mg total) by mouth daily. 04/26/20   Zigmund Gottron, NP  amLODipine (NORVASC) 5 MG tablet Take 1 tablet (5 mg total) by mouth daily. 03/20/20 04/13/20  Rasch, Artist Pais, NP    Family History Family History  Problem Relation Age of Onset  . Vision loss Mother   . Multiple sclerosis Mother   . Hearing loss Maternal Uncle   . Diabetes Maternal Grandmother   . Cancer Paternal Grandmother   . Thyroid disease Sister     Social History Social History   Tobacco Use  . Smoking status: Former Smoker    Packs/day: 0.50    Types: Cigarettes  . Smokeless tobacco: Never Used  . Tobacco comment: quit with +preg test  Vaping Use  . Vaping Use: Never used  Substance  Use Topics  . Alcohol use: Not Currently    Comment: twice a month  . Drug use: No     Allergies   Patient has no known allergies.   Review of Systems Review of Systems   Physical Exam Triage Vital Signs ED Triage Vitals  Enc Vitals Group     BP 04/26/20 1230 112/75     Pulse Rate 04/26/20 1230 89     Resp 04/26/20 1230 16     Temp 04/26/20 1230 98.6 F (37 C)     Temp Source 04/26/20 1230 Oral     SpO2 04/26/20 1230 100 %     Weight --      Height --      Head Circumference --      Peak Flow --      Pain Score 04/26/20 1228 10     Pain Loc --      Pain Edu? --      Excl. in [place]? --    No data found.  Updated Vital Signs BP 112/75 (BP Location: Left Arm)   Pulse 89   Temp 98.6 F (37 C) (Oral)   Resp 16   LMP 04/07/2020   SpO2 100%    Physical Exam Constitutional:      General: She is not in acute distress.    Appearance: She is well-developed.  HENT:     Right Ear: Tympanic membrane and ear canal normal.     Left Ear: Tympanic membrane  and ear canal normal.     Mouth/Throat:     Pharynx: No posterior oropharyngeal erythema.     Tonsils: No tonsillar exudate. 1+ on the right. 1+ on the left.  Cardiovascular:     Rate and Rhythm: Normal rate.  Pulmonary:     Effort: Pulmonary effort is normal.  Lymphadenopathy:     Cervical: No cervical adenopathy.  Skin:    General: Skin is warm and dry.  Neurological:     Mental Status: She is alert and oriented to person, place, and time.      UC Treatments / Results  Labs (all labs ordered are listed, but only abnormal results are displayed) Labs Reviewed - No data to display  EKG   Radiology No results found.  Procedures Procedures (including critical care time)  Medications Ordered in UC Medications - No data to display  Initial Impression / Assessment and Plan / UC Course  I have reviewed the triage vital signs and the nursing notes.  Pertinent labs & imaging results that were available during my care of the patient were reviewed by me and considered in my medical decision making (see chart for details).     Non toxic. Benign physical exam.  No acute findings. Allergic vs vial symptoms. Discussed reflux as source of throat/ esophageal discomfort as well. Return precautions provided. Patient verbalized understanding and agreeable to plan.   Final Clinical Impressions(s) / UC Diagnoses   Final diagnoses:  Right ear pain  Pharyngitis, unspecified etiology     Discharge Instructions     Your exam is overall reassuring here today.  I do recommend using the Flonase consistently for at least approximately 2 weeks to see if this is helpful with your symptoms.  Continue with daily cetirizine.  Sometimes reflux can cause a foreign body type sensation and throat irritation so let trial starting omeprazole and taking daily for the next 2 weeks to see if this is helpful.     ED  Prescriptions    Medication Sig Dispense Auth. Provider   omeprazole (PRILOSEC) 20  MG capsule Take 1 capsule (20 mg total) by mouth daily. 30 capsule Zigmund Gottron, NP     PDMP not reviewed this encounter.   Zigmund Gottron, NP 04/27/20 [phone]

## 2020-05-21 ENCOUNTER — Ambulatory Visit (INDEPENDENT_AMBULATORY_CARE_PROVIDER_SITE_OTHER): Payer: Medicaid Other | Admitting: Internal Medicine

## 2020-05-21 ENCOUNTER — Encounter: Payer: Self-pay | Admitting: Internal Medicine

## 2020-05-21 ENCOUNTER — Other Ambulatory Visit: Payer: Self-pay

## 2020-05-21 VITALS — BP 120/82 | HR 95 | Ht 64.0 in | Wt 204.0 lb

## 2020-05-21 DIAGNOSIS — D352 Benign neoplasm of pituitary gland: Secondary | ICD-10-CM | POA: Diagnosis not present

## 2020-05-21 LAB — COMPREHENSIVE METABOLIC PANEL
ALT: 10 U/L (ref 0–35)
AST: 13 U/L (ref 0–37)
Albumin: 4.4 g/dL (ref 3.5–5.2)
Alkaline Phosphatase: 43 U/L (ref 39–117)
BUN: 11 mg/dL (ref 6–23)
CO2: 29 mEq/L (ref 19–32)
Calcium: 10.1 mg/dL (ref 8.4–10.5)
Chloride: 102 mEq/L (ref 96–112)
Creatinine, Ser: 0.66 mg/dL (ref 0.40–1.20)
GFR: 126.27 mL/min (ref >60.00)
Glucose, Bld: 86 mg/dL (ref 70–99)
Potassium: 3.7 mEq/L (ref 3.5–5.1)
Sodium: 137 mEq/L (ref 135–145)
Total Bilirubin: 0.4 mg/dL (ref 0.2–1.2)
Total Protein: 7.7 g/dL (ref 6.0–8.3)

## 2020-05-21 LAB — T4, FREE: Free T4: 0.83 ng/dL (ref 0.60–1.60)

## 2020-05-21 LAB — PROLACTIN: Prolactin: 325 ng/mL — ABNORMAL HIGH

## 2020-05-21 LAB — TSH: TSH: 0.51 u[IU]/mL (ref 0.35–4.50)

## 2020-05-21 NOTE — Patient Instructions (Signed)
-   Will send you a my chart message after the results of your blood work

## 2020-05-21 NOTE — Progress Notes (Signed)
Name: Patricia Morales  MRN/ DOB: [id number], 1991/07/17    Age/ Sex: 29 y.o., female     PCP: Vassie Moment, MD   Reason for Endocrinology Evaluation: Prolactinoma      Initial Endocrinology Clinic Visit: 04/23/2019    PATIENT IDENTIFIER: Patricia Morales is a 29 y.o., female with a past medical history of Prolactinoma, Abnormal pap and hx of drug abuse. She has followed with [facility] since 04/23/2019 for consultative assistance with management of her prolactinoma   HISTORICAL SUMMARY:  She was diagnosed with prolactinoma in 2012 while living in [place], this was detected during evaluation for secondary amenorrhea. There pt has chronic history of non-compliance. She did not have any medication evaluation for approximately 2-3 yrs prior to her presentation to the [facility] in 03/2019   PCP unable to obtain records from her endocrinologist in [place].   She was on Carbegoline during initial diagnosis,but during pregnancy she was switched to bromocriptine with reported intolerance. On her initial presentation to our clinic she had been on Cabergoline since 07/2018 with intermittent compliance, but by her second visit she was pregnant at [redacted] weeks and had stopped the cabergoline.    She had a brain MRI scheduled for 05/29/19 but she did not show up.   SUBJECTIVE:     Today (05/21/2020):  Patricia Morales is here for a  follow up on prolactinoma. Last time I saw her was in 07/2019. She was [redacted] weeks pregnant at the time. She is S/P delivery 01/2020.    No issues during pregnancy, no headaches or visual changes   Today her main complains have been irregular . LMP  End of 02/2020   Denies nausea  Has occasional headaches but no vision changes Delivery 01/2020  Has attempted to nurse but not enough milk.     She is not on contraception  Needs a follow up with ophthalmology    HISTORY:  Past Medical History:  Past Medical History:    Diagnosis Date   Anxiety    Headache    Infection    UTI   Prolactinoma ([place])    Vaginal Pap smear, abnormal    colpo, in process of eval   Past Surgical History:  Past Surgical History:  Procedure Laterality Date   NO PAST SURGERIES      Social History:  reports that she has quit smoking. Her smoking use included cigarettes. She smoked 0.50 packs per day. She has never used smokeless tobacco. She reports previous alcohol use. She reports that she does not use drugs. Family History:  Family History  Problem Relation Age of Onset   Vision loss Mother    Multiple sclerosis Mother    Hearing loss Maternal Uncle    Diabetes Maternal Grandmother    Cancer Paternal Grandmother    Thyroid disease Sister      HOME MEDICATIONS: Allergies as of 05/21/2020   No Known Allergies     Medication List       Accurate as of May 21, 2020 11:59 PM. If you have any questions, ask your nurse or doctor.        Cetirizine HCl 10 MG Caps Take 1 capsule (10 mg total) by mouth daily.   fluticasone 50 MCG/ACT nasal spray Commonly known as: FLONASE Place 1-2 sprays into both nostrils daily for 7 days.   ibuprofen 800 MG tablet Commonly known as: ADVIL Take 1 tablet (800 mg total) by mouth  3 (three) times daily.   omeprazole 20 MG capsule Commonly known as: PRILOSEC Take 1 capsule (20 mg total) by mouth daily.         OBJECTIVE:   PHYSICAL EXAM: VS: BP 120/82 (BP Location: Right Arm, Patient Position: Sitting, Cuff Size: Normal)    Pulse 95    Ht 5\' 4"  (1.626 m)    Wt 204 lb (92.5 kg)    LMP 02/28/2020 (Approximate)    SpO2 97%    Breastfeeding No    BMI 35.02 kg/m    EXAM: General: Pt appears well and is in NAD  Neck: General: Supple without adenopathy. Thyroid: Thyroid size normal.  No goiter or nodules appreciated. No thyroid bruit.  Lungs: Clear with good BS bilat with no rales, rhonchi, or wheezes  Heart: Auscultation: RRR.  Abdomen: Normoactive bowel  sounds, soft, nontender, without masses or organomegaly palpable  Extremities:  BL LE: No pretibial edema normal ROM and strength.  Skin: Hair: Texture and amount normal with gender appropriate distribution Skin Inspection: No rashes Skin Palpation: Skin temperature, texture, and thickness normal to palpation  Mental Status: Judgment, insight: Intact Orientation: Oriented to time, place, and person Mood and affect: No depression, anxiety, or agitation     DATA REVIEWED: Results for VIVIKA, POYTHRESS (MRN [id number]) as of 05/22/2020 08:16  Ref. Range 05/21/2020 09:38  Sodium Latest Ref Range: 135 - 145 mEq/L 137  Potassium Latest Ref Range: 3.5 - 5.1 mEq/L 3.7  Chloride Latest Ref Range: 96 - 112 mEq/L 102  CO2 Latest Ref Range: 19 - 32 mEq/L 29  Glucose Latest Ref Range: 70 - 99 mg/dL 86  BUN Latest Ref Range: 6 - 23 mg/dL 11  Creatinine Latest Ref Range: 0.40 - 1.20 mg/dL 0.66  Calcium Latest Ref Range: 8.4 - 10.5 mg/dL 10.1  Alkaline Phosphatase Latest Ref Range: 39 - 117 U/L 43  Albumin Latest Ref Range: 3.5 - 5.2 g/dL 4.4  AST Latest Ref Range: 0 - 37 U/L 13  ALT Latest Ref Range: 0 - 35 U/L 10  Total Protein Latest Ref Range: 6.0 - 8.3 g/dL 7.7  Total Bilirubin Latest Ref Range: 0.2 - 1.2 mg/dL 0.4  GFR Latest Ref Range: >60.00 mL/min 126.27  Prolactin Latest Units: ng/mL 325.0 (H)  TSH Latest Ref Range: 0.35 - 4.50 uIU/mL 0.51  T4,Free(Direct) Latest Ref Range: 0.60 - 1.60 ng/dL 0.83    03/26/2019  Prolactin 759.1 ng/mL    07/14/2018 TSH 0.905 uIU/mL  LH 6.3 miu/mL FSH 5.7 miu/mL  Prolactin 584.1 ng/mL    ASSESSMENT / PLAN / RECOMMENDATIONS:   1. Prolactinoma:   - Diagnosed in 2012. Unknown prolactinoma size at initial diagnosis  - Hx of noncompliance .  - Have not been able to proceed with MRI of pituitary thus far, first time she did not show up and subsequently due to pregnancy. Will try again on next visit  - Intolerant to Bromocriptine  - She was again  URGED to see an ophthalmologist for visual field testing - Labs today show normal BMP, TFT but elevated prolactin    Medications :  Cabergoline 0.5 mg, 1 tablet TWICE weekly    F/U in 4 months   Signed electronically by: Mack Guise, MD  [facility]  [facility] [address] [place], [place] [address] Phone: [phone] FAX: [phone]      CC: Vassie Moment, [address] [address] Phone: [phone]  Fax: [phone]  Return to Endocrinology clinic as below: Future Appointments  Date Time Provider Wann  09/24/2020  9:10 AM Shishir Krantz, Melanie Crazier, MD [id number]

## 2020-05-22 MED ORDER — CABERGOLINE 0.5 MG PO TABS: 0.5000 mg | ORAL_TABLET | ORAL | 1 refills | Status: DC

## 2020-06-13 ENCOUNTER — Other Ambulatory Visit: Payer: Self-pay

## 2020-06-13 ENCOUNTER — Ambulatory Visit (HOSPITAL_COMMUNITY)
Admission: EM | Admit: 2020-06-13 | Discharge: 2020-06-13 | Disposition: A | Discharge status: Home / Self Care | Payer: Medicaid Other

## 2020-06-13 ENCOUNTER — Encounter (HOSPITAL_COMMUNITY): Payer: Self-pay

## 2020-06-13 DIAGNOSIS — H6120 Impacted cerumen, unspecified ear: Secondary | ICD-10-CM | POA: Diagnosis not present

## 2020-06-13 NOTE — Discharge Instructions (Signed)
Your ears appear well today, I believe this was a small amount of wax  If you feel wax in your ears in the future, consider using Over the counter Debrox for wax removal  Follow up with your primary care as needed

## 2020-06-13 NOTE — ED Triage Notes (Signed)
Pt present possible foreign object in her left ear. Symptoms started to day.

## 2020-06-13 NOTE — ED Provider Notes (Signed)
[facility]    CSN: [id number] Arrival date & time: 06/13/20  1604      History   Chief Complaint Chief Complaint  Patient presents with  . Otalgia    left ear    HPI Patricia Morales is a 29 y.o. female.   Patient presents for feeling like something was in her left ear.  She reports she noticed this today.  She scooped a little bit of wax out and wonders if this is what it was.  Denies pain.  Denies ringing or buzzing.  Does not use Q-tips or insert anything in the ear.     Past Medical History:  Diagnosis Date  . Anxiety   . Headache   . Infection    UTI  . Prolactinoma ([place])   . Vaginal Pap smear, abnormal    colpo, in process of eval    Patient Active Problem List   Diagnosis Date Noted  . PROM (premature rupture of membranes) 02/12/2020  . Group B Streptococcus carrier, +RV culture, currently pregnant 01/25/2020  . Anemia in pregnancy 08/29/2019  . Obesity in pregnancy 08/13/2019  . Anxiety   . Supervision of high risk pregnancy, antepartum 07/23/2019  . Prolactinoma ([place]) 07/23/2019  . 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 NO PAST SURGERIES      OB History    Gravida  2   Para  2   Term  2   Preterm      AB      Living  2     SAB      TAB      Ectopic      Multiple  0   Live Births  2            Home Medications    Prior to Admission medications   Medication Sig Start Date End Date Taking? Authorizing Provider  cabergoline (DOSTINEX) 0.5 MG tablet Take 1 tablet (0.5 mg total) by mouth 2 (two) times a week. 05/22/20   Shamleffer, Melanie Crazier, MD  Cetirizine HCl 10 MG CAPS Take 1 capsule (10 mg total) by mouth daily. 04/13/20   Wieters, Hallie C, PA-C  fluticasone (FLONASE) 50 MCG/ACT nasal spray Place 1-2 sprays into both nostrils daily for 7 days. 04/13/20 04/20/20  Wieters, Hallie C, PA-C  ibuprofen (ADVIL) 800 MG tablet Take 1 tablet  (800 mg total) by mouth 3 (three) times daily. 04/13/20   Wieters, Hallie C, PA-C  omeprazole (PRILOSEC) 20 MG capsule Take 1 capsule (20 mg total) by mouth daily. 04/26/20   Zigmund Gottron, NP  amLODipine (NORVASC) 5 MG tablet Take 1 tablet (5 mg total) by mouth daily. 03/20/20 04/13/20  Rasch, Artist Pais, NP    Family History Family History  Problem Relation Age of Onset  . Vision loss Mother   . Multiple sclerosis Mother   . Hearing loss Maternal Uncle   . Diabetes Maternal Grandmother   . Cancer Paternal Grandmother   . Thyroid disease Sister     Social History Social History   Tobacco Use  . Smoking status: Former Smoker    Packs/day: 0.50    Types: Cigarettes  . Smokeless tobacco: Never Used  . Tobacco comment: quit with +preg test  Vaping Use  . Vaping Use: Never used  Substance Use Topics  . Alcohol use: Not Currently    Comment: twice a month  .  Drug use: No     Allergies   Patient has no known allergies.   Review of Systems Review of Systems   Physical Exam Triage Vital Signs ED Triage Vitals  Enc Vitals Group     BP 06/13/20 1757 116/77     Pulse Rate 06/13/20 1757 81     Resp 06/13/20 1757 16     Temp 06/13/20 1757 98.6 F (37 C)     Temp Source 06/13/20 1757 Oral     SpO2 06/13/20 1757 99 %     Weight --      Height --      Head Circumference --      Peak Flow --      Pain Score 06/13/20 1758 0     Pain Loc --      Pain Edu? --      Excl. in [place]? --    No data found.  Updated Vital Signs BP 116/77 (BP Location: Right Arm)   Pulse 81   Temp 98.6 F (37 C) (Oral)   Resp 16   SpO2 99%   Visual Acuity Right Eye Distance:   Left Eye Distance:   Bilateral Distance:    Right Eye Near:   Left Eye Near:    Bilateral Near:     Physical Exam Vitals and nursing note reviewed.  Constitutional:      Appearance: Normal appearance.  HENT:     Right Ear: Tympanic membrane normal.     Left Ear: Tympanic membrane normal.     Ears:      Comments: Left ear canal with scant amount of cerumen.  Otherwise no foreign objects.  Able to visualize the entire canal pathway and tympanic membrane.  There is no pain with movement of the tragus or auricle. Neurological:     Mental Status: She is alert.      UC Treatments / Results  Labs (all labs ordered are listed, but only abnormal results are displayed) Labs Reviewed - No data to display  EKG   Radiology No results found.  Procedures Procedures (including critical care time)  Medications Ordered in UC Medications - No data to display  Initial Impression / Assessment and Plan / UC Course  I have reviewed the triage vital signs and the nursing notes.  Pertinent labs & imaging results that were available during my care of the patient were reviewed by me and considered in my medical decision making (see chart for details).     #Earwax in ear Patient is a 29 year old presenting with foreign object feeling in left ear, likely secondary to earwax.  No visible foreign object.  Discussed that in the future if she feels she is having heavy wax burden to use Debrox.  Patient verbalized understanding plan of care Final Clinical Impressions(s) / UC Diagnoses   Final diagnoses:  Wax in ear     Discharge Instructions     Your ears appear well today, I believe this was a small amount of wax  If you feel wax in your ears in the future, consider using Over the counter Debrox for wax removal  Follow up with your primary care as needed    ED Prescriptions    None     PDMP not reviewed this encounter.   Purnell Shoemaker, PA-C 06/13/20 2315

## 2020-07-24 ENCOUNTER — Other Ambulatory Visit: Payer: Self-pay

## 2020-07-24 MED ORDER — CABERGOLINE 0.5 MG PO TABS: 0.5000 mg | ORAL_TABLET | ORAL | 1 refills | Status: DC

## 2020-07-24 NOTE — Telephone Encounter (Signed)
Cabergoline rx sent to [facility].

## 2020-07-24 NOTE — Telephone Encounter (Signed)
New message     1. Which medications need to be refilled? (please list name of each medication and dose if known) cabergoline (DOSTINEX) 0.5 MG tablet  2. Which pharmacy/location (including street and city if local pharmacy) is medication to be sent to? [facility] [address]

## 2020-09-23 ENCOUNTER — Ambulatory Visit: Payer: Medicaid Other | Admitting: Internal Medicine

## 2020-09-23 ENCOUNTER — Encounter: Payer: Self-pay | Admitting: Internal Medicine

## 2020-09-23 ENCOUNTER — Other Ambulatory Visit: Payer: Self-pay

## 2020-09-23 VITALS — BP 120/78 | HR 90 | Ht 64.0 in | Wt 194.4 lb

## 2020-09-23 DIAGNOSIS — D352 Benign neoplasm of pituitary gland: Secondary | ICD-10-CM | POA: Diagnosis not present

## 2020-09-23 NOTE — Patient Instructions (Signed)
-   Please continue Cabergoline , 1 tablet twice a week

## 2020-09-23 NOTE — Progress Notes (Signed)
Name: Patricia Morales  MRN/ DOB: [id number], 01-09-1991    Age/ Sex: 29 y.o., female     PCP: Vassie Moment, MD   Reason for Endocrinology Evaluation: Prolactinoma      Initial Endocrinology Clinic Visit: 04/23/2019    PATIENT IDENTIFIER: Patricia Morales is a 28 y.o., female with a past medical history of Prolactinoma, Abnormal pap and hx of drug abuse. She has followed with [facility] since 04/23/2019 for consultative assistance with management of her prolactinoma   HISTORICAL SUMMARY:  She was diagnosed with prolactinoma in 2012 while living in [place], this was detected during evaluation for secondary amenorrhea. There pt has chronic history of non-compliance. She did not have any medication evaluation for approximately 2-3 yrs prior to her presentation to [facility] in 03/2019   PCP unable to obtain records from her endocrinologist in [place].   She was on Carbegoline during initial diagnosis,but during pregnancy she was switched to bromocriptine with reported intolerance. On her initial presentation to our clinic she had been on Cabergoline since 07/2018 with intermittent compliance, but by her second visit she was pregnant at [redacted] weeks and had stopped the cabergoline.    She had a brain MRI scheduled for 05/29/19 but she did not show up.   SUBJECTIVE:   Today (09/23/2020):  Patricia Morales is here for a  follow up on prolactinoma.  Denies nausea  but has frequent bowel movemet that she attributes this to keto coffee  LMP last week , has been regular  Has noted blurry vision in the morning. Current ophthalmologist   Has been forgetting to take it at times due to busy schedule at home and school      ENDOCRINE HOME MEDICATIONS: Cabergoline 0.5 mg, 1 tablet TWICE weekly        HISTORY:  Past Medical History:  Past Medical History:  Diagnosis Date  . Anxiety   . Headache   . Infection    UTI  . Prolactinoma ([place])    . Vaginal Pap smear, abnormal    colpo, in process of eval   Past Surgical History:  Past Surgical History:  Procedure Laterality Date  . NO PAST SURGERIES      Social History:  reports that she has quit smoking. Her smoking use included cigarettes. She smoked 0.50 packs per day. She has never used smokeless tobacco. She reports previous alcohol use. She reports that she does not use drugs. Family History:  Family History  Problem Relation Age of Onset  . Vision loss Mother   . Multiple sclerosis Mother   . Hearing loss Maternal Uncle   . Diabetes Maternal Grandmother   . Cancer Paternal Grandmother   . Thyroid disease Sister      HOME MEDICATIONS: Allergies as of 09/23/2020   No Known Allergies     Medication List       Accurate as of September 23, 2020  2:24 PM. If you have any questions, ask your nurse or doctor.        cabergoline 0.5 MG tablet Commonly known as: DOSTINEX Take 1 tablet (0.5 mg total) by mouth 2 (two) times a week.   Cetirizine HCl 10 MG Caps Take 1 capsule (10 mg total) by mouth daily.   fluticasone 50 MCG/ACT nasal spray Commonly known as: FLONASE Place 1-2 sprays into both nostrils daily for 7 days.   ibuprofen 800 MG tablet Commonly known as: ADVIL Take 1 tablet (  800 mg total) by mouth 3 (three) times daily.   omeprazole 20 MG capsule Commonly known as: PRILOSEC Take 1 capsule (20 mg total) by mouth daily.       OBJECTIVE:   PHYSICAL EXAM: VS: BP 120/78   Pulse 90   Ht 5\' 4"  (1.626 m)   Wt 194 lb 6 oz (88.2 kg)   SpO2 98%   BMI 33.36 kg/m    EXAM: General: Pt appears well and is in NAD  Lungs: Clear with good BS bilat with no rales, rhonchi, or wheezes  Heart: Auscultation: RRR.  Abdomen: Normoactive bowel sounds, soft, nontender, without masses or organomegaly palpable  Extremities:  BL LE: No pretibial edema normal ROM and strength.  Mental Status: Judgment, insight: Intact Orientation: Oriented to time, place, and  person Mood and affect: No depression, anxiety, or agitation     DATA REVIEWED:  Results for KYNLI, CHOU (MRN [id number]) as of 09/24/2020 08:14  Ref. Range 09/23/2020 14:36  Prolactin Latest Units: ng/mL 13.5    03/26/2019  Prolactin 759.1 ng/mL    07/14/2018 TSH 0.905 uIU/mL  LH 6.3 miu/mL FSH 5.7 miu/mL  Prolactin 584.1 ng/mL    ASSESSMENT / PLAN / RECOMMENDATIONS:   1. Prolactinoma:   - Diagnosed in 2012. Unknown prolactinoma size at initial diagnosis  - Hx of noncompliance  But despite recent imperfect adherence her prolactin has normalized.  - Have not been able to proceed with MRI of pituitary thus far, first time she did not show up (05/2019) and subsequently due to pregnancy. Will try again this time  - Intolerant to Bromocriptine  - She was again URGED to see an ophthalmologist for visual field testing  - Labs today show normal prolactin    Medications :  Continue Cabergoline 0.5 mg, 1 tablet TWICE weekly    F/U in 4 months   Signed electronically by: Mack Guise, MD  [facility]  [facility] [address] [place], [place] [address] Phone: [phone] FAX: [phone]      CC: Vassie Moment, [place] [address] Phone: [phone]  Fax: [phone]   Return to Endocrinology clinic as below: No future appointments.

## 2020-09-24 ENCOUNTER — Ambulatory Visit: Payer: Medicaid Other | Admitting: Internal Medicine

## 2020-09-24 LAB — PROLACTIN: Prolactin: 13.5 ng/mL

## 2020-09-24 MED ORDER — CABERGOLINE 0.5 MG PO TABS: 0.5000 mg | ORAL_TABLET | ORAL | 3 refills | Status: DC

## 2020-10-16 ENCOUNTER — Other Ambulatory Visit: Payer: Medicaid Other

## 2020-11-26 ENCOUNTER — Ambulatory Visit: Payer: Medicaid Other

## 2020-12-11 ENCOUNTER — Encounter (HOSPITAL_COMMUNITY): Payer: Self-pay | Admitting: Obstetrics and Gynecology

## 2020-12-11 ENCOUNTER — Inpatient Hospital Stay (HOSPITAL_COMMUNITY)
Admission: AD | Admit: 2020-12-11 | Discharge: 2020-12-11 | Disposition: A | Discharge status: Home / Self Care | Payer: Medicaid Other | Attending: Obstetrics and Gynecology | Admitting: Obstetrics and Gynecology

## 2020-12-11 ENCOUNTER — Other Ambulatory Visit: Payer: Self-pay

## 2020-12-11 DIAGNOSIS — O036 Delayed or excessive hemorrhage following complete or unspecified spontaneous abortion: Secondary | ICD-10-CM | POA: Diagnosis not present

## 2020-12-11 DIAGNOSIS — O046 Delayed or excessive hemorrhage following (induced) termination of pregnancy: Secondary | ICD-10-CM

## 2020-12-11 LAB — CBC
HCT: 29.1 % — ABNORMAL LOW (ref 36.0–46.0)
Hemoglobin: 10 g/dL — ABNORMAL LOW (ref 12.0–15.0)
MCH: 29.8 pg (ref 26.0–34.0)
MCHC: 34.4 g/dL (ref 30.0–36.0)
MCV: 86.6 fL (ref 80.0–100.0)
Platelets: 224 10*3/uL (ref 150–400)
RBC: 3.36 MIL/uL — ABNORMAL LOW (ref 3.87–5.11)
RDW: 12.5 % (ref 11.5–15.5)
WBC: 7 10*3/uL (ref 4.0–10.5)
nRBC: 0 % (ref 0.0–0.2)

## 2020-12-11 NOTE — MAU Note (Addendum)
.. .  Patricia Morales is a 30 y.o. who was approximately [redacted]w[redacted]d and is 1 day post oral abortion pills here in MAU reporting: heavy vaginal bleeding that began at 2354 last night. Patient reports she took her first dose of oral mifopristone at 1000 on Tuesday and then took vaginal cytotec at 1254 later that same day. Endorses lightheadedness.

## 2020-12-11 NOTE — Discharge Instructions (Signed)
Medication Abortion, Care After The following information offers guidance on how to care for yourself after your procedure. Your health care provider may also give you more specific instructions. If you have problems or questions, contact your health care provider. What can I expect after the procedure? After the procedure, it is common to have:  Bleeding that lasts for a few hours or a few days. It may feel like you are having a heavy menstrual period.  Cramps that are similar to menstrual cramps.  A headache.  Diarrhea.  Nausea and vomiting.  Fever, chills, or hot flushes.  Dizziness. Your next menstrual period will most likely start 4-6 weeks after the procedure, unless you start taking birth control pills. Follow these instructions at home: Medicines  Take over-the-counter and prescription medicines only as told by your health care provider.  Only take the medicines your health care provider recommends. Do not take aspirin. It can cause bleeding. Activity  Do not have sex for 2-3 weeks or until your health care provider approves.  Rest and avoid activity that requires a lot of effort for 2-3 weeks. General instructions  Do not douche or use tampons until your health care provider approves.  Ask your health care provider when you can start using hormonal birth control.  Keep all follow-up visits. This is important.   Contact a health care provider if:  You have a fever of 100.45F (38C) or higher.  You have pain that is not relieved by over-the-counter pain medicine.  You have a bad-smelling vaginal discharge.  You have pain or bleeding that gets worse.  You have any of the following symptoms for more than 24 hours: ? Nausea. ? Vomiting. ? Diarrhea.  You need to change your pad more than once in an hour.  You do not have any bleeding after 3 days. This is a sign that the medicine may not be working. Get help right away if:  You have severe cramps in your  stomach, back, or abdomen.  You become light-headed, weak, or faint. Summary  After the procedure, it is common to have bleeding, headache, diarrhea, nausea and vomiting, chills, and dizziness.  Take over-the-counter and prescription medicines only as told by your health care provider.  Do not have sex until your health care provider approves.  Keep all follow-up visits. This is important. This information is not intended to replace advice given to you by your health care provider. Make sure you discuss any questions you have with your health care provider. Document Revised: 07/24/2020 Document Reviewed: 07/24/2020 Elsevier Patient Education  2021 [employer].

## 2020-12-11 NOTE — MAU Note (Signed)
Pelvic exam performed by Hansel Feinstein, CNM. EBL 286 mL.   Patient states she immediately "feels so much better" and states her cramping is now intermittent and no longer constant.

## 2020-12-11 NOTE — MAU Provider Note (Signed)
Chief Complaint: Vaginal Bleeding, Abdominal Pain, and rectum pain   Event Date/Time   First Provider Initiated Contact with Patient 12/11/20 0047        SUBJECTIVE HPI: Patricia Morales is a 30 y.o. G3P2002 at Unknown by LMP who presents to maternity admissions reporting heavy bleeding after taking the Cytotec portion of a medical abortion.  Took them Tuesday and bled like a period yesterday.  Then tonight bleeding became heavier and she was passing clots.  No dizziness or syncope. She denies vaginal itching/burning, urinary symptoms, h/a, n/v, or fever/chills.    Vaginal Bleeding The patient's primary symptoms include pelvic pain and vaginal bleeding. The patient's pertinent negatives include no genital itching, genital lesions or genital odor. This is a new problem. The current episode started yesterday. The problem occurs constantly. The problem has been unchanged. The pain is moderate. She is pregnant. Associated symptoms include abdominal pain. Pertinent negatives include no chills, constipation, diarrhea, dysuria, fever, nausea or vomiting. The vaginal discharge was bloody. The vaginal bleeding is heavier than menses. She has been passing clots. She has not been passing tissue. Nothing aggravates the symptoms. She has tried nothing for the symptoms. (Medical Abortion)  Abdominal Pain This is a new problem. The current episode started yesterday. The onset quality is gradual. The problem occurs intermittently. The problem has been unchanged. The quality of the pain is cramping. The abdominal pain does not radiate. Pertinent negatives include no constipation, diarrhea, dysuria, fever, nausea or vomiting. Nothing aggravates the pain. The pain is relieved by nothing. She has tried nothing for the symptoms. Medical Abortion   RN Note: Patricia Morales is a 30 y.o. who was approximately [redacted]w[redacted]d and is 1 day post oral abortion pills here in MAU reporting: heavy vaginal bleeding that began at 2354 last  night. Patient reports she took her first dose of oral mifopristone at 1000 on Tuesday and then took vaginal cytotec at 1254 later that same day. Endorses lightheadedness.   Past Medical History:  Diagnosis Date  . Anxiety   . Headache   . Infection    UTI  . Prolactinoma ([place])   . Vaginal Pap smear, abnormal    colpo, in process of eval   Past Surgical History:  Procedure Laterality Date  . NO PAST SURGERIES     Social History   Socioeconomic History  . Marital status: Single    Spouse name: Not on file  . Number of children: Not on file  . Years of education: Not on file  . Highest education level: Not on file  Occupational History  . Not on file  Tobacco Use  . Smoking status: Former Smoker    Packs/day: 0.50    Types: Cigarettes  . Smokeless tobacco: Never Used  . Tobacco comment: quit with +preg test  Vaping Use  . Vaping Use: Never used  Substance and Sexual Activity  . Alcohol use: Not Currently    Comment: twice a month  . Drug use: No  . Sexual activity: Yes    Birth control/protection: None  Other Topics Concern  . Not on file  Social History Narrative  . Not on file   Social Determinants of Health   Financial Resource Strain: Not on file  Food Insecurity: No Food Insecurity  . Worried About Charity fundraiser in the Last Year: Never true  . Ran Out of Food in the Last Year: Never true  Transportation Needs: Unmet Transportation Needs  . Lack of Transportation (Medical): Yes  .  Lack of Transportation (Non-Medical): No  Physical Activity: Not on file  Stress: Not on file  Social Connections: Not on file  Intimate Partner Violence: Not on file   No current facility-administered medications on file prior to encounter.   Current Outpatient Medications on File Prior to Encounter  Medication Sig Dispense Refill  . cabergoline (DOSTINEX) 0.5 MG tablet Take 1 tablet (0.5 mg total) by mouth 2 (two) times a week. 13 tablet 3  . Cetirizine HCl 10 MG  CAPS Take 1 capsule (10 mg total) by mouth daily. 15 capsule 0  . fluticasone (FLONASE) 50 MCG/ACT nasal spray Place 1-2 sprays into both nostrils daily for 7 days. 1 g 0  . ibuprofen (ADVIL) 800 MG tablet Take 1 tablet (800 mg total) by mouth 3 (three) times daily. 21 tablet 0  . omeprazole (PRILOSEC) 20 MG capsule Take 1 capsule (20 mg total) by mouth daily. 30 capsule 0  . [DISCONTINUED] amLODipine (NORVASC) 5 MG tablet Take 1 tablet (5 mg total) by mouth daily. 60 tablet 1   No Known Allergies  I have reviewed patient's Past Medical Hx, Surgical Hx, Family Hx, Social Hx, medications and allergies.   ROS:  Review of Systems  Constitutional: Negative for chills and fever.  Gastrointestinal: Positive for abdominal pain. Negative for constipation, diarrhea, nausea and vomiting.  Genitourinary: Positive for pelvic pain and vaginal bleeding. Negative for dysuria.   Review of Systems  Other systems negative   Physical Exam  Physical Exam Patient Vitals for the past 24 hrs:  BP Temp Temp src Resp SpO2  12/11/20 0043 (!) 121/59 -- -- 19 100 %  12/11/20 0041 -- 98.7 F (37.1 C) Oral -- --  No orthostasis  Vitals:   12/11/20 0140 12/11/20 0142 12/11/20 0157 12/11/20 0211  BP:   (!) 104/53 (!) 93/50  Pulse:  79 79 79  Resp:    17  Temp:      TempSrc:      SpO2: 99%   100%    Constitutional: Well-developed, well-nourished female in no acute distress.  Cardiovascular: normal rate Respiratory: normal effort GI: Abd soft, non-tender. Pos BS x 4 MS: Extremities nontender, no edema, normal ROM Neurologic: Alert and oriented x 4.  GU: Neg CVAT.  PELVIC EXAM: Speculum inserted.  Large amount of clots removed.  Fetus removed from vagina grossly intact.  Bleeding significantly slowed after removal of clots and fetus. Cervix open 1-2cm Cramping improved greatly after removal  LAB RESULTS Results for orders placed or performed during the hospital encounter of 12/11/20 (from the past 24  hour(s))  CBC     Status: Abnormal   Collection Time: 12/11/20  1:08 AM  Result Value Ref Range   WBC 7.0 4.0 - 10.5 K/uL   RBC 3.36 (L) 3.87 - 5.11 MIL/uL   Hemoglobin 10.0 (L) 12.0 - 15.0 g/dL   HCT 29.1 (L) 36.0 - 46.0 %   MCV 86.6 80.0 - 100.0 fL   MCH 29.8 26.0 - 34.0 pg   MCHC 34.4 30.0 - 36.0 g/dL   RDW 12.5 11.5 - 15.5 %   Platelets 224 150 - 400 K/uL   nRBC 0.0 0.0 - 0.2 %   Patient states her hemoglobin at abortion clinic was "10"  --/--/O POS (04/27 1440)  IMAGING No results found.  MAU Management/MDM: Clots and fetus removed from vagina No active hemorrhage visible afterward Pain significantly improved Hemoglobin is stable  ASSESSMENT Single pregnancy at approximately 8 weeks S/P Medical  Termination of pregnancy Hemorrhage, resolved after removal of POCs  PLAN Discharge home Has followup appointment at abortion clinic next week  Pt stable at time of discharge. Encouraged to return here if she develops worsening of symptoms, increase in pain, fever, or other concerning symptoms.    Hansel Feinstein CNM, MSN Certified Nurse-Midwife 12/11/2020  12:47 AM

## 2020-12-26 ENCOUNTER — Emergency Department (HOSPITAL_COMMUNITY): Payer: Medicaid Other

## 2020-12-26 ENCOUNTER — Other Ambulatory Visit: Payer: Self-pay

## 2020-12-26 ENCOUNTER — Encounter (HOSPITAL_COMMUNITY): Payer: Self-pay

## 2020-12-26 ENCOUNTER — Emergency Department (HOSPITAL_COMMUNITY)
Admission: EM | Admit: 2020-12-26 | Discharge: 2020-12-26 | Disposition: A | Discharge status: Home / Self Care | Payer: Medicaid Other | Attending: Emergency Medicine | Admitting: Emergency Medicine

## 2020-12-26 DIAGNOSIS — Z3201 Encounter for pregnancy test, result positive: Secondary | ICD-10-CM

## 2020-12-26 DIAGNOSIS — R1031 Right lower quadrant pain: Secondary | ICD-10-CM | POA: Insufficient documentation

## 2020-12-26 DIAGNOSIS — O034 Incomplete spontaneous abortion without complication: Secondary | ICD-10-CM | POA: Diagnosis not present

## 2020-12-26 DIAGNOSIS — Z86018 Personal history of other benign neoplasm: Secondary | ICD-10-CM | POA: Diagnosis not present

## 2020-12-26 DIAGNOSIS — Z87891 Personal history of nicotine dependence: Secondary | ICD-10-CM | POA: Diagnosis not present

## 2020-12-26 DIAGNOSIS — R110 Nausea: Secondary | ICD-10-CM | POA: Insufficient documentation

## 2020-12-26 DIAGNOSIS — R102 Pelvic and perineal pain: Secondary | ICD-10-CM | POA: Insufficient documentation

## 2020-12-26 DIAGNOSIS — R6883 Chills (without fever): Secondary | ICD-10-CM | POA: Insufficient documentation

## 2020-12-26 DIAGNOSIS — O039 Complete or unspecified spontaneous abortion without complication: Secondary | ICD-10-CM

## 2020-12-26 DIAGNOSIS — Z9889 Other specified postprocedural states: Secondary | ICD-10-CM

## 2020-12-26 LAB — CBC WITH DIFFERENTIAL/PLATELET
Abs Immature Granulocytes: 0.03 10*3/uL (ref 0.00–0.07)
Basophils Absolute: 0 10*3/uL (ref 0.0–0.1)
Basophils Relative: 0 %
Eosinophils Absolute: 0 10*3/uL (ref 0.0–0.5)
Eosinophils Relative: 0 %
HCT: 33 % — ABNORMAL LOW (ref 36.0–46.0)
Hemoglobin: 10.7 g/dL — ABNORMAL LOW (ref 12.0–15.0)
Immature Granulocytes: 0 %
Lymphocytes Relative: 15 %
Lymphs Abs: 1.3 10*3/uL (ref 0.7–4.0)
MCH: 29.6 pg (ref 26.0–34.0)
MCHC: 32.4 g/dL (ref 30.0–36.0)
MCV: 91.4 fL (ref 80.0–100.0)
Monocytes Absolute: 0.4 10*3/uL (ref 0.1–1.0)
Monocytes Relative: 5 %
Neutro Abs: 7.1 10*3/uL (ref 1.7–7.7)
Neutrophils Relative %: 80 %
Platelets: 286 10*3/uL (ref 150–400)
RBC: 3.61 MIL/uL — ABNORMAL LOW (ref 3.87–5.11)
RDW: 13.1 % (ref 11.5–15.5)
WBC: 8.9 10*3/uL (ref 4.0–10.5)
nRBC: 0 % (ref 0.0–0.2)

## 2020-12-26 LAB — COMPREHENSIVE METABOLIC PANEL
ALT: 12 U/L (ref 0–44)
AST: 20 U/L (ref 15–41)
Albumin: 4.1 g/dL (ref 3.5–5.0)
Alkaline Phosphatase: 44 U/L (ref 38–126)
Anion gap: 10 (ref 5–15)
BUN: 7 mg/dL (ref 6–20)
CO2: 22 mmol/L (ref 22–32)
Calcium: 9.6 mg/dL (ref 8.9–10.3)
Chloride: 101 mmol/L (ref 98–111)
Creatinine, Ser: 0.7 mg/dL (ref 0.44–1.00)
GFR, Estimated: >60 mL/min (ref >60)
Glucose, Bld: 88 mg/dL (ref 70–99)
Potassium: 4.5 mmol/L (ref 3.5–5.1)
Sodium: 133 mmol/L — ABNORMAL LOW (ref 135–145)
Total Bilirubin: 1.1 mg/dL (ref 0.3–1.2)
Total Protein: 8.2 g/dL — ABNORMAL HIGH (ref 6.5–8.1)

## 2020-12-26 LAB — WET PREP, GENITAL
Clue Cells Wet Prep HPF POC: NONE SEEN
Sperm: NONE SEEN
Trich, Wet Prep: NONE SEEN
Yeast Wet Prep HPF POC: NONE SEEN

## 2020-12-26 LAB — HCG, QUANTITATIVE, PREGNANCY: hCG, Beta Chain, Quant, S: 34 m[IU]/mL — ABNORMAL HIGH (ref <5)

## 2020-12-26 LAB — LIPASE, BLOOD: Lipase: 24 U/L (ref 11–51)

## 2020-12-26 MED ORDER — SODIUM CHLORIDE 0.9 % IV SOLN
1.0000 g | Freq: Once | INTRAVENOUS | Status: AC
  Administered 2020-12-26: 1 g via INTRAVENOUS
  Filled 2020-12-26: qty 10

## 2020-12-26 MED ORDER — LACTATED RINGERS IV BOLUS
1000.0000 mL | Freq: Once | INTRAVENOUS | Status: AC
  Administered 2020-12-26: 1000 mL via INTRAVENOUS

## 2020-12-26 MED ORDER — IOHEXOL 300 MG/ML  SOLN
100.0000 mL | Freq: Once | INTRAMUSCULAR | Status: AC | PRN
  Administered 2020-12-26: 100 mL via INTRAVENOUS

## 2020-12-26 MED ORDER — OXYCODONE-ACETAMINOPHEN 5-325 MG PO TABS
1.0000 | ORAL_TABLET | Freq: Once | ORAL | Status: AC
  Administered 2020-12-26: 1 via ORAL
  Filled 2020-12-26: qty 1

## 2020-12-26 MED ORDER — OXYCODONE-ACETAMINOPHEN 5-325 MG PO TABS: 1.0000 | ORAL_TABLET | ORAL | 0 refills | Status: DC | PRN

## 2020-12-26 MED ORDER — ONDANSETRON HCL 4 MG/2ML IJ SOLN
4.0000 mg | Freq: Once | INTRAMUSCULAR | Status: AC
  Administered 2020-12-26: 4 mg via INTRAVENOUS
  Filled 2020-12-26: qty 2

## 2020-12-26 MED ORDER — HYDROMORPHONE HCL 1 MG/ML IJ SOLN
1.0000 mg | Freq: Once | INTRAMUSCULAR | Status: AC
  Administered 2020-12-26: 1 mg via INTRAVENOUS
  Filled 2020-12-26: qty 1

## 2020-12-26 MED ORDER — DOXYCYCLINE HYCLATE 100 MG PO CAPS: 100.0000 mg | ORAL_CAPSULE | Freq: Two times a day (BID) | ORAL | 0 refills | Status: DC

## 2020-12-26 MED ORDER — METRONIDAZOLE 500 MG PO TABS: 500.0000 mg | ORAL_TABLET | Freq: Two times a day (BID) | ORAL | 0 refills | Status: DC

## 2020-12-26 MED ORDER — DOXYCYCLINE HYCLATE 100 MG PO TABS
100.0000 mg | ORAL_TABLET | Freq: Once | ORAL | Status: AC
  Administered 2020-12-26: 100 mg via ORAL
  Filled 2020-12-26: qty 1

## 2020-12-26 MED ORDER — MISOPROSTOL 200 MCG PO TABS
1000.0000 ug | ORAL_TABLET | Freq: Once | ORAL | Status: AC
  Administered 2020-12-26: 1000 ug via ORAL
  Filled 2020-12-26: qty 5

## 2020-12-26 MED ORDER — KETOROLAC TROMETHAMINE 30 MG/ML IJ SOLN
30.0000 mg | Freq: Once | INTRAMUSCULAR | Status: AC
  Administered 2020-12-26: 30 mg via INTRAVENOUS
  Filled 2020-12-26: qty 1

## 2020-12-26 MED ORDER — IBUPROFEN 800 MG PO TABS: 800.0000 mg | ORAL_TABLET | Freq: Three times a day (TID) | ORAL | 0 refills | Status: DC

## 2020-12-26 MED ORDER — METRONIDAZOLE 500 MG PO TABS
500.0000 mg | ORAL_TABLET | Freq: Once | ORAL | Status: AC
  Administered 2020-12-26: 500 mg via ORAL
  Filled 2020-12-26: qty 1

## 2020-12-26 NOTE — ED Notes (Signed)
Per MD Pfieffer pt does not need a INR at this time.

## 2020-12-26 NOTE — ED Notes (Signed)
Pt transported to US

## 2020-12-26 NOTE — ED Provider Notes (Signed)
  Physical Exam  BP 120/78   Pulse (!) 113   Temp 98.7 F (37.1 C) (Oral)   Resp 12   SpO2 100%   Physical Exam  ED Course/Procedures   Clinical Course as of 12/26/20 1703  Fri Dec 26, 2020  1515 Consult: Reviewed with Dr. Kennon Rounds OB/GYN.  She has reviewed the ultrasound and CT as well as HPI.  At this time suggests empiric antibiotic therapy and outpatient follow-up. [MP]    Clinical Course User Index [MP] Charlesetta Shanks, MD    Procedures  MDM  Care assumed at 3 PM.  Patient has recent miscarriage and has some blood clots.  Ultrasound was pending at signout.  Case was already discussed with Dr. Kennon Rounds  5:06 PM Ultrasound showed possible retained products.  I discussed case with Dr. Kennon Rounds again.  She states that she does not think it is actually retained products of conception.  She states that this is likely a small blood clot.  She recommend Cytotec at 1000 mcg prior to discharge and follow-up outpatient.  Expect cramps and vaginal bleeding     Drenda Freeze, MD 12/26/20 [id number]

## 2020-12-26 NOTE — ED Notes (Signed)
Pt d/c by MD and is provided w/ d/c instructions and follow up care, Pt out of ED in wheel chair by family

## 2020-12-26 NOTE — ED Triage Notes (Signed)
Pt reports right sided flank/abd pain since Wednesday, states she recently had a medical abortion.

## 2020-12-26 NOTE — ED Notes (Signed)
Pt transported to CT ?

## 2020-12-26 NOTE — ED Provider Notes (Signed)
[place] EMERGENCY DEPARTMENT Provider Note   CSN: [id number] Arrival date & time: 12/26/20  0818     History Chief Complaint  Patient presents with  . Abdominal Pain    Patricia Morales is a 30 y.o. female.  HPI Patient has Cytotec induced abortion that was completed 2\24\2022.  She was seen at the MAU on that day and a approximately 8-week fetus removed from vaginal canal.  Patient was doing well 4 days post procedure.  She reports 4 days ago she started getting fairly intense pain in her right lower abdomen.  She reports since onset, this has severely escalated in pain and now is moving up into her right mid and upper abdomen.  Pain is intensely sharp and burning.  It is worse with any movements.  Is worse lying flat.  She is nauseated but has not vomited.  No diarrhea.  She reports she is continuing to have small amount of vaginal bleeding with some small clots.  No heavy bleeding or large clot passage.  She reports she is had some chills but has not documented a fever.    Past Medical History:  Diagnosis Date  . Anxiety   . Headache   . Infection    UTI  . Prolactinoma ([place])   . Vaginal Pap smear, abnormal    colpo, in process of eval    Patient Active Problem List   Diagnosis Date Noted  . PROM (premature rupture of membranes) 02/12/2020  . Group B Streptococcus carrier, +RV culture, currently pregnant 01/25/2020  . Anemia in pregnancy 08/29/2019  . Obesity in pregnancy 08/13/2019  . Anxiety   . Supervision of high risk pregnancy, antepartum 07/23/2019  . 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 NO PAST SURGERIES       OB History    Gravida  3   Para  2   Term  2   Preterm      AB  1   Living  2     SAB      IAB  1   Ectopic      Multiple  0   Live Births  2           Family History  Problem Relation Age of  Onset  . Vision loss Mother   . Multiple sclerosis Mother   . Hearing loss Maternal Uncle   . Diabetes Maternal Grandmother   . Cancer Paternal Grandmother   . Thyroid disease Sister     Social History   Tobacco Use  . Smoking status: Former Smoker    Packs/day: 0.50    Types: Cigarettes  . Smokeless tobacco: Never Used  . Tobacco comment: quit with +preg test  Vaping Use  . Vaping Use: Never used  Substance Use Topics  . Alcohol use: Not Currently    Comment: twice a month  . Drug use: No    Home Medications Prior to Admission medications   Medication Sig Start Date End Date Taking? Authorizing Provider  cabergoline (DOSTINEX) 0.5 MG tablet Take 1 tablet (0.5 mg total) by mouth 2 (two) times a week. Patient taking differently: Take 0.5 mg by mouth 2 (two) times a week. Monday and Wednesday 09/25/20  Yes Shamleffer, Melanie Crazier, MD  doxycycline (VIBRAMYCIN) 100 MG capsule Take 1 capsule (100 mg total) by mouth 2 (two) times daily. One  po bid x 7 days 12/26/20  Yes Paige Monarrez, Jeannie Done, MD  ibuprofen (ADVIL) 800 MG tablet Take 1 tablet (800 mg total) by mouth 3 (three) times daily. 12/26/20  Yes Charlesetta Shanks, MD  metroNIDAZOLE (FLAGYL) 500 MG tablet Take 1 tablet (500 mg total) by mouth 2 (two) times daily. One po bid x 7 days 12/26/20  Yes Pinchus Weckwerth, Jeannie Done, MD  oxyCODONE-acetaminophen (PERCOCET) 5-325 MG tablet Take 1-2 tablets by mouth every 4 (four) hours as needed. 12/26/20  Yes Charlesetta Shanks, MD  Cetirizine HCl 10 MG CAPS Take 1 capsule (10 mg total) by mouth daily. Patient not taking: Reported on 12/26/2020 04/13/20   Wieters, Hallie C, PA-C  fluticasone (FLONASE) 50 MCG/ACT nasal spray Place 1-2 sprays into both nostrils daily for 7 days. Patient not taking: Reported on 12/26/2020 04/13/20 04/20/20  Wieters, Hallie C, PA-C  ibuprofen (ADVIL) 800 MG tablet Take 1 tablet (800 mg total) by mouth 3 (three) times daily. Patient not taking: Reported on 12/26/2020 04/13/20   Wieters,  Madelynn Done C, PA-C  omeprazole (PRILOSEC) 20 MG capsule Take 1 capsule (20 mg total) by mouth daily. Patient not taking: Reported on 12/26/2020 04/26/20   Augusto Gamble B, NP  amLODipine (NORVASC) 5 MG tablet Take 1 tablet (5 mg total) by mouth daily. 03/20/20 04/13/20  Rasch, Artist Pais, NP    Allergies    Patient has no known allergies.  Review of Systems   Review of Systems 10 systems reviewed and negative except as per HPI Physical Exam Updated Vital Signs BP 106/60   Pulse 72   Temp 98.7 F (37.1 C) (Oral)   Resp (!) 23   SpO2 100%   Physical Exam Constitutional:      Comments: Alert and nontoxic.  In severe pain.  No respiratory distress.  HENT:     Mouth/Throat:     Pharynx: Oropharynx is clear.  Eyes:     Extraocular Movements: Extraocular movements intact.  Cardiovascular:     Comments: Borderline tachycardia.  Regular no gross rub murmur gallop. Pulmonary:     Effort: Pulmonary effort is normal.     Breath sounds: Normal breath sounds.  Abdominal:     Comments: Abdomen is soft.  Severe pain to palpation right lower quadrant and right mid quadrant.  Genitourinary:    Comments: Normal external female genitalia.  Speculum modest amount of red blood in the vault.  No brisk active bleeding.  No clots present.  No clots obstructing the os. Musculoskeletal:        General: No swelling or tenderness. Normal range of motion.     Right lower leg: No edema.     Left lower leg: No edema.  Skin:    General: Skin is warm and dry.  Neurological:     General: No focal deficit present.     Coordination: Coordination normal.     ED Results / Procedures / Treatments   Labs (all labs ordered are listed, but only abnormal results are displayed) Labs Reviewed  WET PREP, GENITAL - Abnormal; Notable for the following components:      Result Value   WBC, Wet Prep HPF POC MANY (*)    All other components within normal limits  COMPREHENSIVE METABOLIC PANEL - Abnormal; Notable for the  following components:   Sodium 133 (*)    Total Protein 8.2 (*)    All other components within normal limits  CBC WITH DIFFERENTIAL/PLATELET - Abnormal; Notable for the following components:   RBC 3.61 (*)  Hemoglobin 10.7 (*)    HCT 33.0 (*)    All other components within normal limits  HCG, QUANTITATIVE, PREGNANCY - Abnormal; Notable for the following components:   hCG, Beta Chain, Quant, S 34 (*)    All other components within normal limits  LIPASE, BLOOD  GC/CHLAMYDIA PROBE AMP ([place]) NOT AT [facility]    EKG None  Radiology CT Abdomen Pelvis W Contrast  Result Date: 12/26/2020 CLINICAL DATA:  Severe right lower quadrant pain 2 weeks post medically induced abortion. EXAM: CT ABDOMEN AND PELVIS WITH CONTRAST TECHNIQUE: Multidetector CT imaging of the abdomen and pelvis was performed using the standard protocol following bolus administration of intravenous contrast. CONTRAST:  171mL OMNIPAQUE IOHEXOL 300 MG/ML  SOLN COMPARISON:  06/18/2016 abdominal sonogram. FINDINGS: Lower chest: Patchy ground-glass opacity in the dependent lung bases, favor hypoventilatory change. Hepatobiliary: Normal liver size. No liver mass. Normal gallbladder with no radiopaque cholelithiasis. No biliary ductal dilatation. Pancreas: Normal, with no mass or duct dilation. Spleen: Normal size. No mass. Adrenals/Urinary Tract: Normal adrenals. Normal kidneys with no hydronephrosis and no renal mass. Normal bladder. Stomach/Bowel: Normal non-distended stomach. Normal caliber small bowel with no small bowel wall thickening. Normal diminutive appendix. Normal large bowel with no diverticulosis, large bowel wall thickening or pericolonic fat stranding. Vascular/Lymphatic: Minimally atherosclerotic nonaneurysmal abdominal aorta. Patent portal, splenic, hepatic and renal veins. No pathologically enlarged lymph nodes in the abdomen or pelvis. Reproductive: Grossly normal anteverted uterus. No adnexal masses. There is a small  ovoid hypodense 1.8 x 1.3 cm mass in the anterior right pelvis in the expected location of the round ligament just deep to the right inguinal canal (series 3/image 72). Other: No pneumoperitoneum. No ascites. Nonspecific generalized haziness of the omental and central mesenteric fat. Musculoskeletal: No aggressive appearing focal osseous lesions. IMPRESSION: 1. No evidence of bowel obstruction or acute bowel inflammation. Normal appendix. 2. Nonspecific generalized haziness of the omental and central mesenteric fat, which could be inflammatory such as secondary to pelvic inflammatory disease. 3. Small ovoid hypodense 1.8 cm mass in the anterior right pelvis in the expected location of the round ligament just deep to the right inguinal canal, potentially a small hematoma or other small complex collection. Consider attention on short-term follow-up CT abdomen/pelvis with oral and IV contrast. 4. Aortic Atherosclerosis (ICD10-I70.0). Electronically Signed   By: Ilona Sorrel M.D.   On: 12/26/2020 13:10    Procedures Procedures   Medications Ordered in ED Medications  cefTRIAXone (ROCEPHIN) 1 g in sodium chloride 0.9 % 100 mL IVPB (has no administration in time range)  doxycycline (VIBRA-TABS) tablet 100 mg (has no administration in time range)  metroNIDAZOLE (FLAGYL) tablet 500 mg (has no administration in time range)  oxyCODONE-acetaminophen (PERCOCET/ROXICET) 5-325 MG per tablet 1 tablet (has no administration in time range)  HYDROmorphone (DILAUDID) injection 1 mg (1 mg Intravenous Given 12/26/20 0902)  lactated ringers bolus 1,000 mL (0 mLs Intravenous Stopped 12/26/20 1212)  ondansetron (ZOFRAN) injection 4 mg (4 mg Intravenous Given 12/26/20 0904)  HYDROmorphone (DILAUDID) injection 1 mg (1 mg Intravenous Given 12/26/20 1209)  iohexol (OMNIPAQUE) 300 MG/ML solution 100 mL (100 mLs Intravenous Contrast Given 12/26/20 1223)  ketorolac (TORADOL) 30 MG/ML injection 30 mg (30 mg Intravenous Given 12/26/20  1332)    ED Course  I have reviewed the triage vital signs and the nursing notes.  Pertinent labs & imaging results that were available during my care of the patient were reviewed by me and considered in my medical decision  making (see chart for details).  Clinical Course as of 12/26/20 1524  Fri Dec 26, 2020  1515 Consult: Reviewed with Dr. Kennon Rounds OB/GYN.  She has reviewed the ultrasound and CT as well as HPI.  At this time suggests empiric antibiotic therapy and outpatient follow-up. [MP]    Clinical Course User Index [MP] Charlesetta Shanks, MD   MDM Rules/Calculators/A&P                          Patient status post Cytotec induced medical abortion completed on 2\24 with documented removal of 8-week fetus at MAU from vaginal canal.  Patient did not have any surgical instrumentation by review of MAU note.  Patient was well about 4 days post procedure.  For 4 days now she has been having fairly severe right lower quadrant pain that is now migrating up into her right upper abdomen.  Differential includes retained products of conception, endometritis, torsion, possible second ectopic pregnancy, appendicitis, kidney stone, cholecystitis\biliary colic.  Patient is nontoxic.  She is not hypotensive.  At this time we will proceed with fluid resuscitation, pain control with Dilaudid and Zofran.  We will proceed with CT scan and possibly pelvic ultrasound.  CT scan shows some haziness.  No acute surgical processes.  I have reviewed results with Dr. Kennon Rounds.  This time plan will be to empirically treat with antibiotics.  No significant appearance of retained products of conception or other acute surgical process.  Patient is comfortable pain controlled at this time.  Return precautions reviewed. Final Clinical Impression(s) / ED Diagnoses Final diagnoses:  Pelvic pain in female  Completed inevitable abortion    Rx / DC Orders ED Discharge Orders         Ordered    metroNIDAZOLE (FLAGYL) 500 MG tablet   2 times daily        12/26/20 1518    doxycycline (VIBRAMYCIN) 100 MG capsule  2 times daily        12/26/20 1518    ibuprofen (ADVIL) 800 MG tablet  3 times daily        12/26/20 1520    oxyCODONE-acetaminophen (PERCOCET) 5-325 MG tablet  Every 4 hours PRN        12/26/20 1520           Charlesetta Shanks, MD 12/26/20 1524

## 2020-12-26 NOTE — ED Notes (Signed)
Pt vomiting again this RN notified MD

## 2020-12-26 NOTE — Discharge Instructions (Addendum)
1.  Follow-up with your gynecological clinic at the beginning of the week or with the [facility] for women's health care. 2.  Continue doxycycline and Flagyl as prescribed for the next week.  You cannot drink any alcohol while you are taking Flagyl.  It will make you very sick. 3.  Return to the emergency department if you are having worsening pain, fever, heavy bleeding or other concerning symptoms. 4.  You may take ibuprofen every 8 hours for pain.  You may also take 1 Percocet tablet every 6 hours as needed for additional pain control.

## 2021-02-28 ENCOUNTER — Other Ambulatory Visit: Payer: Self-pay

## 2021-02-28 ENCOUNTER — Emergency Department (HOSPITAL_COMMUNITY)
Admission: EM | Admit: 2021-02-28 | Discharge: 2021-03-01 | Disposition: A | Discharge status: Home / Self Care | Payer: Medicaid Other | Attending: Emergency Medicine | Admitting: Emergency Medicine

## 2021-02-28 DIAGNOSIS — R0602 Shortness of breath: Secondary | ICD-10-CM | POA: Insufficient documentation

## 2021-02-28 DIAGNOSIS — R059 Cough, unspecified: Secondary | ICD-10-CM | POA: Diagnosis not present

## 2021-02-28 DIAGNOSIS — J029 Acute pharyngitis, unspecified: Secondary | ICD-10-CM | POA: Diagnosis not present

## 2021-02-28 DIAGNOSIS — R531 Weakness: Secondary | ICD-10-CM | POA: Diagnosis present

## 2021-02-28 DIAGNOSIS — A599 Trichomoniasis, unspecified: Secondary | ICD-10-CM | POA: Diagnosis not present

## 2021-02-28 DIAGNOSIS — N3001 Acute cystitis with hematuria: Secondary | ICD-10-CM | POA: Diagnosis not present

## 2021-02-28 DIAGNOSIS — R519 Headache, unspecified: Secondary | ICD-10-CM | POA: Diagnosis not present

## 2021-02-28 DIAGNOSIS — R079 Chest pain, unspecified: Secondary | ICD-10-CM | POA: Insufficient documentation

## 2021-02-28 DIAGNOSIS — Z87891 Personal history of nicotine dependence: Secondary | ICD-10-CM | POA: Diagnosis not present

## 2021-02-28 DIAGNOSIS — Z20822 Contact with and (suspected) exposure to covid-19: Secondary | ICD-10-CM | POA: Insufficient documentation

## 2021-02-28 LAB — URINALYSIS, ROUTINE W REFLEX MICROSCOPIC
Bilirubin Urine: NEGATIVE
Glucose, UA: NEGATIVE mg/dL
Ketones, ur: NEGATIVE mg/dL
Nitrite: NEGATIVE
Protein, ur: NEGATIVE mg/dL
Specific Gravity, Urine: 1.012 (ref 1.005–1.030)
pH: 5 (ref 5.0–8.0)

## 2021-02-28 LAB — BASIC METABOLIC PANEL
Anion gap: 7 (ref 5–15)
BUN: 5 mg/dL — ABNORMAL LOW (ref 6–20)
CO2: 25 mmol/L (ref 22–32)
Calcium: 9.7 mg/dL (ref 8.9–10.3)
Chloride: 100 mmol/L (ref 98–111)
Creatinine, Ser: 0.65 mg/dL (ref 0.44–1.00)
GFR, Estimated: >60 mL/min (ref >60)
Glucose, Bld: 88 mg/dL (ref 70–99)
Potassium: 3.5 mmol/L (ref 3.5–5.1)
Sodium: 132 mmol/L — ABNORMAL LOW (ref 135–145)

## 2021-02-28 LAB — CBC
HCT: 33.8 % — ABNORMAL LOW (ref 36.0–46.0)
Hemoglobin: 11.4 g/dL — ABNORMAL LOW (ref 12.0–15.0)
MCH: 29.7 pg (ref 26.0–34.0)
MCHC: 33.7 g/dL (ref 30.0–36.0)
MCV: 88 fL (ref 80.0–100.0)
Platelets: 222 10*3/uL (ref 150–400)
RBC: 3.84 MIL/uL — ABNORMAL LOW (ref 3.87–5.11)
RDW: 13.7 % (ref 11.5–15.5)
WBC: 7.7 10*3/uL (ref 4.0–10.5)
nRBC: 0 % (ref 0.0–0.2)

## 2021-02-28 LAB — I-STAT BETA HCG BLOOD, ED (MC, WL, AP ONLY): I-stat hCG, quantitative: <5 m[IU]/mL (ref <5)

## 2021-02-28 NOTE — ED Triage Notes (Signed)
Pt states she has fatigue, aching all over, sore throat, headache and shortness of breath x 1 week. Pt states she was tested for covid and flu and both were negative. Pt states she is concerned because she was cleaning a house that had a lot of rat feces and she did not have a mask on. Pt states she also had unprotected sex and has a foul smelling white vaginal discharge.

## 2021-03-01 ENCOUNTER — Emergency Department (HOSPITAL_COMMUNITY): Payer: Medicaid Other

## 2021-03-01 LAB — RESP PANEL BY RT-PCR (FLU A&B, COVID) ARPGX2
Influenza A by PCR: NEGATIVE
Influenza B by PCR: NEGATIVE
SARS Coronavirus 2 by RT PCR: NEGATIVE

## 2021-03-01 LAB — GROUP A STREP BY PCR: Group A Strep by PCR: NOT DETECTED

## 2021-03-01 MED ORDER — CEPHALEXIN 500 MG PO CAPS: 500.0000 mg | ORAL_CAPSULE | Freq: Two times a day (BID) | ORAL | 0 refills | Status: DC

## 2021-03-01 MED ORDER — FLUCONAZOLE 150 MG PO TABS
150.0000 mg | ORAL_TABLET | Freq: Once | ORAL | Status: AC
  Administered 2021-03-01: 150 mg via ORAL
  Filled 2021-03-01: qty 1

## 2021-03-01 MED ORDER — KETOROLAC TROMETHAMINE 30 MG/ML IJ SOLN
30.0000 mg | Freq: Once | INTRAMUSCULAR | Status: AC
  Administered 2021-03-01: 30 mg via INTRAVENOUS
  Filled 2021-03-01: qty 1

## 2021-03-01 MED ORDER — LACTATED RINGERS IV BOLUS
1000.0000 mL | Freq: Once | INTRAVENOUS | Status: AC
  Administered 2021-03-01: 1000 mL via INTRAVENOUS

## 2021-03-01 MED ORDER — PHENOL 1.4 % MT LIQD
1.0000 | OROMUCOSAL | Status: DC | PRN
  Administered 2021-03-01: 1 via OROMUCOSAL
  Filled 2021-03-01: qty 177

## 2021-03-01 MED ORDER — METRONIDAZOLE 500 MG PO TABS: 500.0000 mg | ORAL_TABLET | Freq: Two times a day (BID) | ORAL | 0 refills | Status: DC

## 2021-03-01 NOTE — ED Notes (Signed)
Patient verbalizes understanding of discharge instructions. Opportunity for questioning and answers were provided. Armband removed by staff, pt discharged from ED and ambulated to lobby to return home.   

## 2021-03-01 NOTE — ED Provider Notes (Signed)
[place] EMERGENCY DEPARTMENT Provider Note   CSN: [id number] Arrival date & time: 02/28/21  2020     History Chief Complaint  Patient presents with  . Weakness    Patricia Morales is a 30 y.o. female.  The history is provided by the patient.  Weakness Severity:  Moderate Onset quality:  Gradual Duration:  1 week Timing:  Constant Progression:  Unchanged Chronicity:  New Worsened by:  Nothing Associated symptoms: abdominal pain, chest pain, cough, fever, myalgias, nausea and shortness of breath   Associated symptoms: no vomiting   Patient with history of prolactinoma presents with multiple complaints.  She reports for the past week she has had chills, fatigue, sore throat, headache, body aches.  She also reports coughing at times will have brief chest pain.  She also mentions having vaginal discharge as well.  Patient reports she was recently exposed to rat feces in her car and in a house that she cleaned that she concerned that was causing her issues. She reports she was tested for COVID and flu at urgent care that was negative     Past Medical History:  Diagnosis Date  . Anxiety   . Headache   . Infection    UTI  . Prolactinoma ([place])   . Vaginal Pap smear, abnormal    colpo, in process of eval    Patient Active Problem List   Diagnosis Date Noted  . PROM (premature rupture of membranes) 02/12/2020  . Group B Streptococcus carrier, +RV culture, currently pregnant 01/25/2020  . Anemia in pregnancy 08/29/2019  . Obesity in pregnancy 08/13/2019  . Anxiety   . Supervision of high risk pregnancy, antepartum 07/23/2019  . 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 NO PAST SURGERIES       OB History    Gravida  3   Para  2   Term  2   Preterm      AB  1   Living  2     SAB      IAB  1   Ectopic      Multiple  0   Live  Births  2           Family History  Problem Relation Age of Onset  . Vision loss Mother   . Multiple sclerosis Mother   . Hearing loss Maternal Uncle   . Diabetes Maternal Grandmother   . Cancer Paternal Grandmother   . Thyroid disease Sister     Social History   Tobacco Use  . Smoking status: Former Smoker    Packs/day: 0.50    Types: Cigarettes  . Smokeless tobacco: Never Used  . Tobacco comment: quit with +preg test  Vaping Use  . Vaping Use: Never used  Substance Use Topics  . Alcohol use: Not Currently    Comment: twice a month  . Drug use: No    Home Medications Prior to Admission medications   Medication Sig Start Date End Date Taking? Authorizing Provider  cabergoline (DOSTINEX) 0.5 MG tablet Take 1 tablet (0.5 mg total) by mouth 2 (two) times a week. Patient taking differently: Take 0.5 mg by mouth 2 (two) times a week. Monday and Wednesday 09/25/20   Shamleffer, Melanie Crazier, MD  Cetirizine HCl 10 MG CAPS Take 1 capsule (10 mg total) by mouth daily. Patient not taking: Reported on 12/26/2020  04/13/20   Wieters, Hallie C, PA-C  fluticasone (FLONASE) 50 MCG/ACT nasal spray Place 1-2 sprays into both nostrils daily for 7 days. Patient not taking: Reported on 12/26/2020 04/13/20 04/20/20  Wieters, Madelynn Done C, PA-C  omeprazole (PRILOSEC) 20 MG capsule Take 1 capsule (20 mg total) by mouth daily. Patient not taking: Reported on 12/26/2020 04/26/20   Augusto Gamble B, NP  amLODipine (NORVASC) 5 MG tablet Take 1 tablet (5 mg total) by mouth daily. 03/20/20 04/13/20  Rasch, Artist Pais, NP    Allergies    Patient has no known allergies.  Review of Systems   Review of Systems  Constitutional: Positive for fatigue and fever.  HENT: Positive for congestion and sore throat.   Respiratory: Positive for cough and shortness of breath.   Cardiovascular: Positive for chest pain.  Gastrointestinal: Positive for abdominal pain and nausea. Negative for vomiting.  Genitourinary:  Positive for vaginal discharge.  Musculoskeletal: Positive for myalgias.  Neurological: Positive for weakness.  All other systems reviewed and are negative.   Physical Exam Updated Vital Signs BP 105/71   Pulse 81   Temp 98.9 F (37.2 C) (Oral)   Resp (!) 23   SpO2 98%   Physical Exam CONSTITUTIONAL: Well developed/well nourished HEAD: Normocephalic/atraumatic EYES: EOMI/PERRL ENMT: Mucous membranes moist NECK: supple no meningeal signs SPINE/BACK:entire spine nontender CV: S1/S2 noted, no murmurs/rubs/gallops noted LUNGS: Lungs are clear to auscultation bilaterally, no apparent distress ABDOMEN: soft, nontender, no rebound or guarding, bowel sounds noted throughout abdomen GU:no cva tenderness NEURO: Pt is awake/alert/appropriate, moves all extremitiesx4.  No facial droop.  No arm or leg drift EXTREMITIES: pulses normal/equal, full ROM SKIN: warm, color normal PSYCH: no abnormalities of mood noted, alert and oriented to situation  ED Results / Procedures / Treatments   Labs (all labs ordered are listed, but only abnormal results are displayed) Labs Reviewed  BASIC METABOLIC PANEL - Abnormal; Notable for the following components:      Result Value   Sodium 132 (*)    BUN 5 (*)    All other components within normal limits  CBC - Abnormal; Notable for the following components:   RBC 3.84 (*)    Hemoglobin 11.4 (*)    HCT 33.8 (*)    All other components within normal limits  URINALYSIS, ROUTINE W REFLEX MICROSCOPIC - Abnormal; Notable for the following components:   APPearance CLOUDY (*)    Hgb urine dipstick MODERATE (*)    Leukocytes,Ua LARGE (*)    Bacteria, UA FEW (*)    Trichomonas, UA PRESENT (*)    All other components within normal limits  RESP PANEL BY RT-PCR (FLU A&B, COVID) ARPGX2  I-STAT BETA HCG BLOOD, ED ([facility], [facility], [facility] ONLY)  GC/CHLAMYDIA PROBE AMP ([place]) NOT AT [facility]    EKG EKG Interpretation  Date/Time:  Sunday Mar 01 2021 01:39:29  EDT Ventricular Rate:  78 PR Interval:  169 QRS Duration: 90 QT Interval:  435 QTC Calculation: 496 R Axis:   84 Text Interpretation: Sinus rhythm Borderline T abnormalities, anterior leads Confirmed by Ripley Fraise [phone]) on 03/01/2021 1:54:59 AM   Radiology DG Chest Port 1 View  Result Date: 03/01/2021 CLINICAL DATA:  Cough and fatigue, shortness of breath x1 week EXAM: PORTABLE CHEST 1 VIEW COMPARISON:  June 16, 2016 FINDINGS: The heart size and mediastinal contours are within normal limits. Both lungs are clear. The visualized skeletal structures are unremarkable. IMPRESSION: No active disease. Electronically Signed   By: Dahlia Bailiff  MD   On: 03/01/2021 01:33    Procedures Procedures   Medications Ordered in ED Medications  phenol (CHLORASEPTIC) mouth spray 1 spray (1 spray Mouth/Throat Given 03/01/21 0219)  fluconazole (DIFLUCAN) tablet 150 mg (has no administration in time range)  lactated ringers bolus 1,000 mL (0 mLs Intravenous Stopped 03/01/21 0253)  ketorolac (TORADOL) 30 MG/ML injection 30 mg (30 mg Intravenous Given 03/01/21 0219)    ED Course  I have reviewed the triage vital signs and the nursing notes.  Pertinent labs & imaging results that were available during my care of the patient were reviewed by me and considered in my medical decision making (see chart for details).    MDM Rules/Calculators/A&P                          2:01 AM Patient presents for multiple complaints.  Reports flulike illness for the past week.  She also reports having vaginal discharge.  Urinalysis reveals UTI as well as trichomonas.  Will add on COVID-19 testing, as well as chest x-ray and treat her symptoms. GC/Chlamydia testing has been added onto her urine study Patient reports she was concerned because she was exposed to rat feces, but this does not seem to be related 3:16 AM Patient appears improved.  No acute distress.  Strep/COVID/flu testing negative.  Chest x-ray was  reviewed by myself and is negative. Patient also reports recent yeast infection, will give Diflucan. Patient has evidence of UTI as well as trichomonas.  Will treat with Keflex as well as Flagyl (patient informed to avoid alcohol while on Flagyl) GC/chlamydia testing pending at this time Overall patient is appropriate for discharge home.  She had 1 reading of a pulse ox of 93, but the rest of been above 95%. Low suspicion for other acute cardiopulmonary emergency. No focal weakness to suggest stroke. Body aches and chills may be related to underlying urinary tract infection Final Clinical Impression(s) / ED Diagnoses Final diagnoses:  Weakness  Acute cystitis with hematuria  Trichomonas infection    Rx / DC Orders ED Discharge Orders         Ordered    cephALEXin (KEFLEX) 500 MG capsule  2 times daily        03/01/21 0315    metroNIDAZOLE (FLAGYL) 500 MG tablet  2 times daily        03/01/21 0315           Ripley Fraise, MD 03/01/21 [phone]

## 2021-03-02 LAB — GC/CHLAMYDIA PROBE AMP (~~LOC~~) NOT AT ARMC
Chlamydia: NEGATIVE
Comment: NEGATIVE
Comment: NORMAL
Neisseria Gonorrhea: NEGATIVE

## 2021-04-23 ENCOUNTER — Inpatient Hospital Stay (HOSPITAL_COMMUNITY)
Admission: AD | Admit: 2021-04-23 | Discharge: 2021-04-23 | Disposition: A | Discharge status: Home / Self Care | Payer: Medicaid Other | Attending: Obstetrics and Gynecology | Admitting: Obstetrics and Gynecology

## 2021-04-23 ENCOUNTER — Ambulatory Visit (HOSPITAL_COMMUNITY)
Admission: EM | Admit: 2021-04-23 | Discharge: 2021-04-23 | Disposition: A | Discharge status: Home / Self Care | Payer: Medicaid Other

## 2021-04-23 ENCOUNTER — Other Ambulatory Visit: Payer: Self-pay

## 2021-04-23 ENCOUNTER — Encounter (HOSPITAL_COMMUNITY): Payer: Self-pay

## 2021-04-23 DIAGNOSIS — A5901 Trichomonal vulvovaginitis: Secondary | ICD-10-CM

## 2021-04-23 DIAGNOSIS — Z87891 Personal history of nicotine dependence: Secondary | ICD-10-CM | POA: Diagnosis not present

## 2021-04-23 DIAGNOSIS — Z79899 Other long term (current) drug therapy: Secondary | ICD-10-CM | POA: Diagnosis not present

## 2021-04-23 DIAGNOSIS — N898 Other specified noninflammatory disorders of vagina: Secondary | ICD-10-CM

## 2021-04-23 LAB — WET PREP, GENITAL
Clue Cells Wet Prep HPF POC: NONE SEEN
Sperm: NONE SEEN
WBC, Wet Prep HPF POC: NONE SEEN
Yeast Wet Prep HPF POC: NONE SEEN

## 2021-04-23 LAB — POCT PREGNANCY, URINE: Preg Test, Ur: NEGATIVE

## 2021-04-23 MED ORDER — METRONIDAZOLE 500 MG PO TABS
2000.0000 mg | ORAL_TABLET | Freq: Once | ORAL | Status: AC
  Administered 2021-04-23: 2000 mg via ORAL
  Filled 2021-04-23: qty 4

## 2021-04-23 NOTE — ED Provider Notes (Addendum)
[place]    CSN: [id number] Arrival date & time: 04/23/21  0935      History   Chief Complaint Chief Complaint  Patient presents with   Vaginal Discharge   SEXUALLY TRANSMITTED DISEASE    HPI Patricia Morales is a 30 y.o. female.   Patient presents with fishy pungent odor and discharge for two weeks. Took abortion pill on 6/30, began bleeding the next day, bleeding has begun to subside. Above symptoms seem less present once bleeding initially began but has become more prevalent as bleeding is starting to subside. Has not had post abortion follow up. Due on 7/11. Denies abdominal pain, nausea, vomiting, fever, chills, itching, irritation, vaginal pain.   Past Medical History:  Diagnosis Date   Anxiety    Headache    Infection    UTI   Prolactinoma ([place])    Vaginal Pap smear, abnormal    colpo, in process of eval    Patient Active Problem List   Diagnosis Date Noted   PROM (premature rupture of membranes) 02/12/2020   Group B Streptococcus carrier, +RV culture, currently pregnant 01/25/2020   Anemia in pregnancy 08/29/2019   Obesity in pregnancy 08/13/2019   Anxiety    Supervision of high risk pregnancy, antepartum 07/23/2019   Prolactinoma ([place]) 07/23/2019   Pap smear of cervix shows high risk HPV present 08/22/2018    Past Surgical History:  Procedure Laterality Date   NO PAST SURGERIES      OB History     Gravida  3   Para  2   Term  2   Preterm      AB  1   Living  2      SAB      IAB  1   Ectopic      Multiple  0   Live Births  2            Home Medications    Prior to Admission medications   Medication Sig Start Date End Date Taking? Authorizing Provider  cabergoline (DOSTINEX) 0.5 MG tablet Take 1 tablet (0.5 mg total) by mouth 2 (two) times a week. Patient taking differently: Take 0.5 mg by mouth 2 (two) times a week. Monday and Wednesday 09/25/20   Shamleffer, Melanie Crazier, MD  cephALEXin (KEFLEX) 500 MG  capsule Take 1 capsule (500 mg total) by mouth 2 (two) times daily. 03/01/21   Ripley Fraise, MD  Cetirizine HCl 10 MG CAPS Take 1 capsule (10 mg total) by mouth daily. Patient not taking: Reported on 12/26/2020 04/13/20   Wieters, Hallie C, PA-C  fluticasone (FLONASE) 50 MCG/ACT nasal spray Place 1-2 sprays into both nostrils daily for 7 days. Patient not taking: Reported on 12/26/2020 04/13/20 04/20/20  Wieters, Hallie C, PA-C  metroNIDAZOLE (FLAGYL) 500 MG tablet Take 1 tablet (500 mg total) by mouth 2 (two) times daily. One po bid x 7 days 03/01/21   Ripley Fraise, MD  omeprazole (PRILOSEC) 20 MG capsule Take 1 capsule (20 mg total) by mouth daily. Patient not taking: Reported on 12/26/2020 04/26/20   Augusto Gamble B, NP  amLODipine (NORVASC) 5 MG tablet Take 1 tablet (5 mg total) by mouth daily. 03/20/20 04/13/20  Rasch, Artist Pais, NP    Family History Family History  Problem Relation Age of Onset   Vision loss Mother    Multiple sclerosis Mother    Hearing loss Maternal Uncle    Diabetes Maternal Grandmother    Cancer Paternal Grandmother  Thyroid disease Sister     Social History Social History   Tobacco Use   Smoking status: Former    Packs/day: 0.50    Pack years: 0.00    Types: Cigarettes   Smokeless tobacco: Never   Tobacco comments:    quit with +preg test  Vaping Use   Vaping Use: Never used  Substance Use Topics   Alcohol use: Not Currently    Comment: twice a month   Drug use: No     Allergies   Patient has no known allergies.   Review of Systems Review of Systems   Physical Exam Triage Vital Signs ED Triage Vitals  Enc Vitals Group     BP 04/23/21 1139 (!) 128/95     Pulse Rate 04/23/21 1139 75     Resp 04/23/21 1139 18     Temp 04/23/21 1139 98 F (36.7 C)     Temp Source 04/23/21 1139 Oral     SpO2 04/23/21 1139 98 %     Weight --      Height --      Head Circumference --      Peak Flow --      Pain Score 04/23/21 1140 0     Pain Loc --       Pain Edu? --      Excl. in [place]? --    No data found.  Updated Vital Signs BP (!) 128/95 (BP Location: Left Arm)   Pulse 75   Temp 98 F (36.7 C) (Oral)   Resp 18   SpO2 98%   Visual Acuity Right Eye Distance:   Left Eye Distance:   Bilateral Distance:    Right Eye Near:   Left Eye Near:    Bilateral Near:     Physical Exam   UC Treatments / Results  Labs (all labs ordered are listed, but only abnormal results are displayed) Labs Reviewed - No data to display  EKG   Radiology No results found.  Procedures Procedures (including critical care time)  Medications Ordered in UC Medications - No data to display  Initial Impression / Assessment and Plan / UC Course  I have reviewed the triage vital signs and the nursing notes.  Pertinent labs & imaging results that were available during my care of the patient were reviewed by me and considered in my medical decision making (see chart for details).  Vaginal odor  Symptoms most likely related to sti such as bacterial vaginosis however since patient had recent abortion and has not had follow up, she is being sent to MAU to assessment to rule out complications and proper clearly of fetus. No signs of distress, vital signs stable, to escort self to MAU.  Final Clinical Impressions(s) / UC Diagnoses   Final diagnoses:  None   Discharge Instructions   None    ED Prescriptions   None    PDMP not reviewed this encounter.   Hans Eden, NP 04/23/21 [id number] [place], [place] 05/04/21 [phone]

## 2021-04-23 NOTE — MAU Note (Signed)
Presents stating prior to abortion had vaginal discharge and odor, now status post abortion requesting STI testing.

## 2021-04-23 NOTE — ED Triage Notes (Signed)
Pt present vaginal odor with discharge. Symptoms started two weeks ago. Pt recently had a abortion and was having these symptoms.

## 2021-04-23 NOTE — MAU Provider Note (Signed)
History     CSN: [id number]  Arrival date and time: 04/23/21 1211   Event Date/Time   First Provider Initiated Contact with Patient 04/23/21 1318      Chief Complaint  Patient presents with   STI testing   HPI Patricia Morales is a 30 y.o. [id number] at Unknown who presents from Urgent Care for evaluation of foul smelling discharge s/p abortion. Patient reports medical abortion on June 30th. Bleeding has since stopped and denies abdominal pain or fever. Reports foul smelling discharge that has been ongoing for over a month. Would like STI testing. No other symptoms.   OB History     Gravida  3   Para  2   Term  2   Preterm      AB  1   Living  2      SAB      IAB  1   Ectopic      Multiple  0   Live Births  2           Past Medical History:  Diagnosis Date   Anxiety    Headache    Infection    UTI   Prolactinoma ([facility])    Vaginal Pap smear, abnormal    colpo, in process of eval    Past Surgical History:  Procedure Laterality Date   NO PAST SURGERIES      Family History  Problem Relation Age of Onset   Vision loss Mother    Multiple sclerosis Mother    Hearing loss Maternal Uncle    Diabetes Maternal Grandmother    Cancer Paternal Grandmother    Thyroid disease Sister     Social History   Tobacco Use   Smoking status: Former    Packs/day: 0.50    Pack years: 0.00    Types: Cigarettes   Smokeless tobacco: Never   Tobacco comments:    quit with +preg test  Vaping Use   Vaping Use: Never used  Substance Use Topics   Alcohol use: Not Currently    Comment: twice a month   Drug use: No    Allergies: No Known Allergies  Medications Prior to Admission  Medication Sig Dispense Refill Last Dose   cabergoline (DOSTINEX) 0.5 MG tablet Take 1 tablet (0.5 mg total) by mouth 2 (two) times a week. (Patient taking differently: Take 0.5 mg by mouth 2 (two) times a week. Monday and Wednesday) 13 tablet 3    cephALEXin (KEFLEX) 500 MG capsule  Take 1 capsule (500 mg total) by mouth 2 (two) times daily. 14 capsule 0    Cetirizine HCl 10 MG CAPS Take 1 capsule (10 mg total) by mouth daily. (Patient not taking: Reported on 12/26/2020) 15 capsule 0    fluticasone (FLONASE) 50 MCG/ACT nasal spray Place 1-2 sprays into both nostrils daily for 7 days. (Patient not taking: Reported on 12/26/2020) 1 g 0    metroNIDAZOLE (FLAGYL) 500 MG tablet Take 1 tablet (500 mg total) by mouth 2 (two) times daily. One po bid x 7 days 14 tablet 0    omeprazole (PRILOSEC) 20 MG capsule Take 1 capsule (20 mg total) by mouth daily. (Patient not taking: Reported on 12/26/2020) 30 capsule 0     Review of Systems  Constitutional: Negative.   Gastrointestinal: Negative.   Genitourinary:  Positive for vaginal discharge. Negative for dysuria and vaginal bleeding.  Physical Exam   Blood pressure 112/75, pulse 68, temperature 98.1 F (36.7 C), temperature source  Oral, resp. rate 20, height 5\' 4"  (1.626 m), weight 78 kg, SpO2 100 %, not currently breastfeeding.  Physical Exam Vitals and nursing note reviewed.  Constitutional:      General: She is not in acute distress.    Appearance: Normal appearance.  HENT:     Head: Normocephalic and atraumatic.  Eyes:     General: No scleral icterus. Pulmonary:     Effort: Pulmonary effort is normal. No respiratory distress.  Neurological:     Mental Status: She is alert.  Psychiatric:        Mood and Affect: Mood normal.        Behavior: Behavior normal.    MAU Course  Procedures Results for orders placed or performed during the hospital encounter of 04/23/21 (from the past 24 hour(s))  Pregnancy, urine POC     Status: None   Collection Time: 04/23/21 12:21 PM  Result Value Ref Range   Preg Test, Ur NEGATIVE NEGATIVE  Wet prep, genital     Status: Abnormal   Collection Time: 04/23/21 12:50 PM   Specimen: Vaginal  Result Value Ref Range   Yeast Wet Prep HPF POC NONE SEEN NONE SEEN   Trich, Wet Prep PRESENT (A)  NONE SEEN   Clue Cells Wet Prep HPF POC NONE SEEN NONE SEEN   WBC, Wet Prep HPF POC NONE SEEN NONE SEEN   Sperm NONE SEEN     MDM UPT negative.  Patient requesting STI testing due to foul smelling discharge that started prior to her termination.  Wet prep positive today for trichomonas.  Given flagyl 2 gm PO in MAU.   Assessment and Plan   1. Trichomonal vaginitis    -no intercourse x 2 weeks -partner needs testing & treatment -GC/CT pending  Jorje Guild 04/23/2021, 1:18 PM

## 2021-04-24 LAB — GC/CHLAMYDIA PROBE AMP (~~LOC~~) NOT AT ARMC
Chlamydia: NEGATIVE
Comment: NEGATIVE
Comment: NORMAL
Neisseria Gonorrhea: NEGATIVE

## 2021-06-13 ENCOUNTER — Other Ambulatory Visit: Payer: Self-pay | Admitting: Internal Medicine

## 2021-06-15 NOTE — Telephone Encounter (Signed)
   Notes to clinic:  Last appt was 09/23/2020 Due for follow up on 01/22/2021 Review for  refills No future appt schedule     Requested Prescriptions  Pending Prescriptions Disp Refills   cabergoline (DOSTINEX) 0.5 MG tablet [Pharmacy Med Name: CABERGOLINE 0.5 MG TABLET] 8 tablet     Sig: TAKE 1 TABLET BY MOUTH TWO TIMES A WEEK     There is no refill protocol information for this order

## 2021-06-28 ENCOUNTER — Other Ambulatory Visit: Payer: Self-pay

## 2021-06-28 ENCOUNTER — Encounter (HOSPITAL_COMMUNITY): Payer: Self-pay | Admitting: *Deleted

## 2021-06-28 ENCOUNTER — Ambulatory Visit (HOSPITAL_COMMUNITY)
Admission: EM | Admit: 2021-06-28 | Discharge: 2021-06-28 | Disposition: A | Discharge status: Home / Self Care | Payer: Medicaid Other | Attending: Emergency Medicine | Admitting: Emergency Medicine

## 2021-06-28 DIAGNOSIS — N898 Other specified noninflammatory disorders of vagina: Secondary | ICD-10-CM | POA: Insufficient documentation

## 2021-06-28 MED ORDER — METRONIDAZOLE 500 MG PO TABS: 500.0000 mg | ORAL_TABLET | Freq: Two times a day (BID) | ORAL | 0 refills | Status: DC

## 2021-06-28 NOTE — ED Provider Notes (Signed)
[place]    CSN: [id number] Arrival date & time: 06/28/21  1058      History   Chief Complaint Chief Complaint  Patient presents with   Vaginal Odor    HPI Patricia Morales is a 30 y.o. female.   Patient presents with fishy pungent odor for 4 days.  Denies discharge, itching, vaginal irritation, dysuria, urinary frequency or urgency, abdominal pain or pressure, flank pain, fever.  Had similar symptoms 1 month ago, positive for trichomonas, tested taking all medication prescribed.  Does not believe partner completed treatment for trichomoniasis.  Sexually active, 1 partner, no condom use.  History of recent abortion within the last 2 months, no complications.  Past Medical History:  Diagnosis Date   Anxiety    Headache    Infection    UTI   Prolactinoma ([place])    Vaginal Pap smear, abnormal    colpo, in process of eval    Patient Active Problem List   Diagnosis Date Noted   PROM (premature rupture of membranes) 02/12/2020   Group B Streptococcus carrier, +RV culture, currently pregnant 01/25/2020   Anemia in pregnancy 08/29/2019   Obesity in pregnancy 08/13/2019   Anxiety    Supervision of high risk pregnancy, antepartum 07/23/2019   Prolactinoma ([place]) 07/23/2019   Pap smear of cervix shows high risk HPV present 08/22/2018    Past Surgical History:  Procedure Laterality Date   NO PAST SURGERIES      OB History     Gravida  3   Para  2   Term  2   Preterm      AB  1   Living  2      SAB      IAB  1   Ectopic      Multiple  0   Live Births  2            Home Medications    Prior to Admission medications   Medication Sig Start Date End Date Taking? Authorizing Provider  metroNIDAZOLE (FLAGYL) 500 MG tablet Take 1 tablet (500 mg total) by mouth 2 (two) times daily. 06/28/21  Yes Navah Grondin R, NP  amLODipine (NORVASC) 5 MG tablet Take 1 tablet (5 mg total) by mouth daily. 03/20/20 04/13/20  Rasch, Artist Pais, NP     Family History Family History  Problem Relation Age of Onset   Vision loss Mother    Multiple sclerosis Mother    Hearing loss Maternal Uncle    Diabetes Maternal Grandmother    Cancer Paternal Grandmother    Thyroid disease Sister     Social History Social History   Tobacco Use   Smoking status: Every Day    Packs/day: 0.50    Types: Cigarettes   Smokeless tobacco: Never   Tobacco comments:    quit with +preg test  Vaping Use   Vaping Use: Never used  Substance Use Topics   Alcohol use: Yes    Comment: occasionally   Drug use: No     Allergies   Patient has no known allergies.   Review of Systems Review of Systems  Constitutional: Negative.   Respiratory: Negative.    Cardiovascular: Negative.   Genitourinary: Negative.   Skin: Negative.     Physical Exam Triage Vital Signs ED Triage Vitals  Enc Vitals Group     BP 06/28/21 1222 (!) 110/55     Pulse Rate 06/28/21 1222 94     Resp 06/28/21 1222  18     Temp 06/28/21 1222 98.5 F (36.9 C)     Temp Source 06/28/21 1222 Oral     SpO2 06/28/21 1222 99 %     Weight --      Height --      Head Circumference --      Peak Flow --      Pain Score 06/28/21 1224 0     Pain Loc --      Pain Edu? --      Excl. in [place]? --    No data found.  Updated Vital Signs BP (!) 110/55   Pulse 94   Temp 98.5 F (36.9 C) (Oral)   Resp 18   LMP 06/15/2021 (Exact Date)   SpO2 99%   Breastfeeding No   Visual Acuity Right Eye Distance:   Left Eye Distance:   Bilateral Distance:    Right Eye Near:   Left Eye Near:    Bilateral Near:     Physical Exam Constitutional:      Appearance: Normal appearance. She is normal weight.  HENT:     Head: Normocephalic.  Eyes:     Extraocular Movements: Extraocular movements intact.  Pulmonary:     Effort: Pulmonary effort is normal.  Genitourinary:    Comments: Deferred, self collect vaginal swab Skin:    General: Skin is warm and dry.  Neurological:     Mental  Status: She is alert and oriented to person, place, and time. Mental status is at baseline.  Psychiatric:        Mood and Affect: Mood normal.        Behavior: Behavior normal.     UC Treatments / Results  Labs (all labs ordered are listed, but only abnormal results are displayed) Labs Reviewed  CERVICOVAGINAL ANCILLARY ONLY    EKG   Radiology No results found.  Procedures Procedures (including critical care time)  Medications Ordered in UC Medications - No data to display  Initial Impression / Assessment and Plan / UC Course  I have reviewed the triage vital signs and the nursing notes.  Pertinent labs & imaging results that were available during my care of the patient were reviewed by me and considered in my medical decision making (see chart for details).  Vaginal odor  Due to history will treat prophylactically for trichomoniasis while awaiting STI results  Metronidazole 500 mg twice daily for 7 days, given precautions for use of alcohol while using medication, advised abstinence until all medication complete and symptoms have resolved, advised abstinence if positive until partner completes treatment as well 2.  STI screening pending, will treat per protocol Final Clinical Impressions(s) / UC Diagnoses   Final diagnoses:  Vaginal odor     Discharge Instructions      Today you are being treated prophylactically for trichomoniasis  Take metronidazole twice a day for the next 7 days  Labs pending 2-3 days, you will be contacted if positive for any sti and treatment will be sent to the pharmacy, you will have to return to the clinic if positive for gonorrhea to receive treatment   Please refrain from having sex until labs results, if positive please refrain from having sex until treatment complete and symptoms resolve   If positive for Chlamydia  gonorrhea or trichomoniasis please notify partner or partners so they may tested as well  Moving forward, it is  recommended you use some form of protection against the transmission of sti infections  such as condoms or dental dams with each sexual encounter     ED Prescriptions     Medication Sig Dispense Auth. Provider   metroNIDAZOLE (FLAGYL) 500 MG tablet Take 1 tablet (500 mg total) by mouth 2 (two) times daily. 14 tablet Ziah Leandro, Leitha Schuller, NP      PDMP not reviewed this encounter.   Hans Eden, NP 06/28/21 1250

## 2021-06-28 NOTE — ED Triage Notes (Signed)
Denies vaginal discharge.  C/o vaginal odor x 2 days.  Denies pain.

## 2021-06-28 NOTE — Discharge Instructions (Addendum)
Today you are being treated prophylactically for trichomoniasis  Take metronidazole twice a day for the next 7 days  Labs pending 2-3 days, you will be contacted if positive for any sti and treatment will be sent to the pharmacy, you will have to return to the clinic if positive for gonorrhea to receive treatment   Please refrain from having sex until labs results, if positive please refrain from having sex until treatment complete and symptoms resolve   If positive for Chlamydia  gonorrhea or trichomoniasis please notify partner or partners so they may tested as well  Moving forward, it is recommended you use some form of protection against the transmission of sti infections  such as condoms or dental dams with each sexual encounter

## 2021-06-29 LAB — CERVICOVAGINAL ANCILLARY ONLY
Bacterial Vaginitis (gardnerella): POSITIVE — AB
Candida Glabrata: NEGATIVE
Candida Vaginitis: NEGATIVE
Chlamydia: NEGATIVE
Comment: NEGATIVE
Comment: NEGATIVE
Comment: NEGATIVE
Comment: NEGATIVE
Comment: NEGATIVE
Comment: NORMAL
Neisseria Gonorrhea: NEGATIVE
Trichomonas: POSITIVE — AB

## 2021-08-21 ENCOUNTER — Other Ambulatory Visit: Payer: Self-pay | Admitting: Internal Medicine

## 2021-08-24 ENCOUNTER — Telehealth: Payer: Self-pay | Admitting: Internal Medicine

## 2021-08-24 NOTE — Telephone Encounter (Signed)
Attempted to contact the patient to inform Dr. Kelton Pillar will discuss medication refill at her upcoming appt, voicemail box has not yet been set up.

## 2021-08-24 NOTE — Telephone Encounter (Signed)
MEDICATION: Caberdoline  PHARMACY:   [id number] - [place], [place] Phone:  [phone]  Fax:  [phone]     HAS THE PATIENT CONTACTED THEIR PHARMACY?  Yes  IS THIS A 90 DAY SUPPLY : Yes  IS PATIENT OUT OF MEDICATION: Yes  IF NOT; HOW MUCH IS LEFT: 0  LAST APPOINTMENT DATE: @Visit  date not found  NEXT APPOINTMENT DATE:@11 /06/2021  DO WE HAVE YOUR PERMISSION TO LEAVE A DETAILED MESSAGE?:Yes  OTHER COMMENTS:   **Let patient know to contact pharmacy at the end of the day to make sure medication is ready. **  ** Please notify patient to allow 48-72 hours to process**  **Encourage patient to contact the pharmacy for refills or they can request refills through [facility]**

## 2021-08-26 ENCOUNTER — Ambulatory Visit (INDEPENDENT_AMBULATORY_CARE_PROVIDER_SITE_OTHER): Payer: Medicaid Other | Admitting: Internal Medicine

## 2021-08-26 ENCOUNTER — Other Ambulatory Visit: Payer: Self-pay

## 2021-08-26 VITALS — BP 130/82 | HR 90 | Ht 64.0 in | Wt 173.0 lb

## 2021-08-26 DIAGNOSIS — D352 Benign neoplasm of pituitary gland: Secondary | ICD-10-CM | POA: Diagnosis not present

## 2021-08-26 LAB — PROLACTIN: Prolactin: 10.2 ng/mL

## 2021-08-26 LAB — HCG, SERUM, QUALITATIVE: Preg, Serum: NEGATIVE

## 2021-08-26 NOTE — Progress Notes (Signed)
Name: Patricia Morales  MRN/ DOB: [id number], 18-Jan-1991    Age/ Sex: 30 y.o., female     PCP: Vassie Moment, MD (Inactive)   Reason for Endocrinology Evaluation: Prolactinoma      Initial Endocrinology Clinic Visit: 04/23/2019    PATIENT IDENTIFIER: Patricia Morales is a 30 y.o., female with a past medical history of Prolactinoma, Abnormal pap and hx of drug abuse. She has followed with [facility] since 04/23/2019 for consultative assistance with management of her prolactinoma   HISTORICAL SUMMARY:  She was diagnosed with prolactinoma in 2012 while living in [place], this was detected during evaluation for secondary amenorrhea. There pt has chronic history of non-compliance. She did not have any medication evaluation for approximately 2-3 yrs prior to her presentation to the [facility] in 03/2019    PCP unable to obtain records from her endocrinologist in [place].    She was on Carbegoline during initial diagnosis,but during pregnancy she was switched to bromocriptine with reported intolerance. On her initial presentation to our clinic she had been on Cabergoline since 07/2018 with intermittent compliance, but by her second visit she was pregnant at [redacted] weeks and had stopped the cabergoline.     She had a brain MRI scheduled for 05/29/19 but she did not show up. I ordered it again in 2021 but she again did not have this done   SUBJECTIVE:   Today (09/23/2020):  Patricia Morales is here for a  follow up on prolactinoma. She has not been to our office in 11 months.   She has had multiple ED visit for STD's and vaginitis    She tells me she continues to take Cabergoline 1 tablet twice a week   Denies headaches  Denies vision changes  No contraception  Denies galactorrhea  LMP  08/07/2021      ENDOCRINE HOME MEDICATIONS: Cabergoline 0.5 mg, 1 tablet TWICE weekly        HISTORY:  Past Medical History:  Past Medical History:   Diagnosis Date   Anxiety    Headache    Infection    UTI   Prolactinoma ([place])    Vaginal Pap smear, abnormal    colpo, in process of eval   Past Surgical History:  Past Surgical History:  Procedure Laterality Date   NO PAST SURGERIES     Social History:  reports that she has been smoking cigarettes. She has been smoking an average of .5 packs per day. She has never used smokeless tobacco. She reports current alcohol use. She reports that she does not use drugs. Family History:  Family History  Problem Relation Age of Onset   Vision loss Mother    Multiple sclerosis Mother    Hearing loss Maternal Uncle    Diabetes Maternal Grandmother    Cancer Paternal Grandmother    Thyroid disease Sister      HOME MEDICATIONS: Allergies as of 08/26/2021   No Known Allergies      Medication List        Accurate as of August 26, 2021  3:27 PM. If you have any questions, ask your nurse or doctor.          metroNIDAZOLE 500 MG tablet Commonly known as: FLAGYL Take 1 tablet (500 mg total) by mouth 2 (two) times daily.        OBJECTIVE:   PHYSICAL EXAM: VS: BP 130/82 (BP Location: Right Arm, Patient Position: Sitting, Cuff Size: Normal)  Pulse 90   Ht 5\' 4"  (1.626 m)   Wt 173 lb (78.5 kg)   SpO2 97%   BMI 29.70 kg/m    EXAM: General: Pt appears well and is in NAD  Lungs: Clear with good BS bilat with no rales, rhonchi, or wheezes  Heart: Auscultation: RRR.  Abdomen: Normoactive bowel sounds, soft, nontender, without masses or organomegaly palpable  Extremities:  BL LE: No pretibial edema normal ROM and strength.  Mental Status: Judgment, insight: Intact Orientation: Oriented to time, place, and person Mood and affect: No depression, anxiety, or agitation     DATA REVIEWED: Results for Patricia Morales, Patricia Morales (MRN [id number]) as of 08/27/2021 10:21  Ref. Range 08/26/2021 15:43  Prolactin Latest Units: ng/mL 10.2  Preg, Serum Unknown NEGATIVE     03/26/2019    Prolactin 759.1 ng/mL      07/14/2018 TSH 0.905 uIU/mL  LH 6.3 miu/mL FSH 5.7 miu/mL  Prolactin 584.1 ng/mL     ASSESSMENT / PLAN / RECOMMENDATIONS:   Prolactinoma:    - Diagnosed in 2012. Unknown prolactinoma size. 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 I have encouraged the pt to get this done  - Intolerant to Bromocriptine  - Prolactin is normal despite being off cabergoline for a month, will not restart , will recheck in 8 weeks    Medications :  Stop Cabergoline 0.5 mg, 1 tablet TWICE weekly    F/U in 3 months   Signed electronically by: Mack Guise, MD  [facility]  [facility] [address] [place], [place] [address] Phone: [phone] FAX: [phone]      CC: Vassie Moment, MD (Inactive) No address on file Phone: None  Fax: None   Return to Endocrinology clinic as below: Future Appointments  Date Time Provider [place]  08/26/2021  3:40 PM Kodee Drury, Melanie Crazier, MD [id number] None

## 2021-08-27 ENCOUNTER — Other Ambulatory Visit: Payer: Self-pay

## 2021-08-27 ENCOUNTER — Emergency Department (HOSPITAL_BASED_OUTPATIENT_CLINIC_OR_DEPARTMENT_OTHER)
Admission: EM | Admit: 2021-08-27 | Discharge: 2021-08-27 | Disposition: A | Discharge status: Home / Self Care | Payer: Medicaid Other | Attending: Emergency Medicine | Admitting: Emergency Medicine

## 2021-08-27 ENCOUNTER — Telehealth: Payer: Self-pay | Admitting: Internal Medicine

## 2021-08-27 ENCOUNTER — Encounter (HOSPITAL_BASED_OUTPATIENT_CLINIC_OR_DEPARTMENT_OTHER): Payer: Self-pay | Admitting: Emergency Medicine

## 2021-08-27 ENCOUNTER — Emergency Department (HOSPITAL_BASED_OUTPATIENT_CLINIC_OR_DEPARTMENT_OTHER): Payer: Medicaid Other

## 2021-08-27 DIAGNOSIS — R102 Pelvic and perineal pain: Secondary | ICD-10-CM | POA: Insufficient documentation

## 2021-08-27 DIAGNOSIS — R1031 Right lower quadrant pain: Secondary | ICD-10-CM | POA: Insufficient documentation

## 2021-08-27 DIAGNOSIS — N898 Other specified noninflammatory disorders of vagina: Secondary | ICD-10-CM | POA: Diagnosis not present

## 2021-08-27 DIAGNOSIS — F17210 Nicotine dependence, cigarettes, uncomplicated: Secondary | ICD-10-CM | POA: Diagnosis not present

## 2021-08-27 LAB — URINALYSIS, ROUTINE W REFLEX MICROSCOPIC
Bilirubin Urine: NEGATIVE
Glucose, UA: NEGATIVE mg/dL
Ketones, ur: NEGATIVE mg/dL
Leukocytes,Ua: NEGATIVE
Nitrite: NEGATIVE
Specific Gravity, Urine: 1.027 (ref 1.005–1.030)
pH: 6.5 (ref 5.0–8.0)

## 2021-08-27 LAB — COMPREHENSIVE METABOLIC PANEL
ALT: 6 U/L (ref 0–44)
AST: 8 U/L — ABNORMAL LOW (ref 15–41)
Albumin: 4.2 g/dL (ref 3.5–5.0)
Alkaline Phosphatase: 45 U/L (ref 38–126)
Anion gap: 6 (ref 5–15)
BUN: 10 mg/dL (ref 6–20)
CO2: 29 mmol/L (ref 22–32)
Calcium: 9.4 mg/dL (ref 8.9–10.3)
Chloride: 103 mmol/L (ref 98–111)
Creatinine, Ser: 0.55 mg/dL (ref 0.44–1.00)
GFR, Estimated: >60 mL/min (ref >60)
Glucose, Bld: 70 mg/dL (ref 70–99)
Potassium: 3.5 mmol/L (ref 3.5–5.1)
Sodium: 138 mmol/L (ref 135–145)
Total Bilirubin: 0.3 mg/dL (ref 0.3–1.2)
Total Protein: 7.4 g/dL (ref 6.5–8.1)

## 2021-08-27 LAB — CBC
HCT: 33.3 % — ABNORMAL LOW (ref 36.0–46.0)
Hemoglobin: 11.4 g/dL — ABNORMAL LOW (ref 12.0–15.0)
MCH: 30 pg (ref 26.0–34.0)
MCHC: 34.2 g/dL (ref 30.0–36.0)
MCV: 87.6 fL (ref 80.0–100.0)
Platelets: 251 10*3/uL (ref 150–400)
RBC: 3.8 MIL/uL — ABNORMAL LOW (ref 3.87–5.11)
RDW: 12.8 % (ref 11.5–15.5)
WBC: 4.7 10*3/uL (ref 4.0–10.5)
nRBC: 0 % (ref 0.0–0.2)

## 2021-08-27 LAB — WET PREP, GENITAL
Clue Cells Wet Prep HPF POC: NONE SEEN
Sperm: NONE SEEN
Trich, Wet Prep: NONE SEEN
Yeast Wet Prep HPF POC: NONE SEEN

## 2021-08-27 LAB — TSH: TSH: 0.55 u[IU]/mL (ref 0.35–5.50)

## 2021-08-27 LAB — T4, FREE: Free T4: 0.75 ng/dL (ref 0.60–1.60)

## 2021-08-27 LAB — PREGNANCY, URINE: Preg Test, Ur: NEGATIVE

## 2021-08-27 LAB — LIPASE, BLOOD: Lipase: 30 U/L (ref 11–51)

## 2021-08-27 MED ORDER — MORPHINE SULFATE (PF) 4 MG/ML IV SOLN
4.0000 mg | Freq: Once | INTRAVENOUS | Status: AC
  Administered 2021-08-27: 4 mg via INTRAVENOUS
  Filled 2021-08-27: qty 1

## 2021-08-27 MED ORDER — DOXYCYCLINE HYCLATE 100 MG PO TABS
100.0000 mg | ORAL_TABLET | Freq: Once | ORAL | Status: AC
  Administered 2021-08-27: 100 mg via ORAL
  Filled 2021-08-27: qty 1

## 2021-08-27 MED ORDER — IOHEXOL 300 MG/ML  SOLN
100.0000 mL | Freq: Once | INTRAMUSCULAR | Status: AC | PRN
  Administered 2021-08-27: 100 mL via INTRAVENOUS

## 2021-08-27 MED ORDER — DOXYCYCLINE HYCLATE 100 MG PO TABS: 100.0000 mg | ORAL_TABLET | Freq: Two times a day (BID) | ORAL | 0 refills | Status: AC

## 2021-08-27 MED ORDER — LIDOCAINE HCL (PF) 1 % IJ SOLN
1.0000 mL | Freq: Once | INTRAMUSCULAR | Status: AC
  Administered 2021-08-27: 1 mL
  Filled 2021-08-27: qty 5

## 2021-08-27 MED ORDER — CEFTRIAXONE SODIUM 500 MG IJ SOLR
500.0000 mg | Freq: Once | INTRAMUSCULAR | Status: AC
  Administered 2021-08-27: 500 mg via INTRAMUSCULAR
  Filled 2021-08-27: qty 500

## 2021-08-27 NOTE — ED Notes (Signed)
Pt given oral contrast.

## 2021-08-27 NOTE — ED Provider Notes (Signed)
[facility] EMERGENCY DEPT Provider Note   CSN: [id number] Arrival date & time: 08/27/21  1607     History Chief Complaint  Patient presents with   Abdominal Pain    Patricia Morales is a 30 y.o. female with a history of previous STI infections.  Presents to the emergency department with a complaint of right lower quadrant and pelvic pain.  Patient reports that her pain has been constant over the last 4 days.  Pain is worse with movement.  Patient has not tried any modalities to alleviate her symptoms.  Patient rates her pain 9/10 on pain scale.  Patient describes pain as "sharp cramping."  Patient endorses nausea today.  Patient endorses vaginal discharge over the last 4 days.  Describes discharge as "white creamy color but not clumpy."  Patient states that discharge has a "foul odor."  Patient denies any constipation, diarrhea, blood in stool, melena, dysuria, hematuria, urinary urgency, vaginal pain, vaginal bleeding, fevers, chills.  Patient is sexually active with female and female partners.  Patient has multiple partners.  Patient is not on any forms of birth control.  Patient does not always use condoms for penetrative vaginal sex.  G4 P2-0-2-2.  Patient endorses occasional alcohol use.  Patient denies any illicit drug use.  Denies any history of abdominal surgeries.   Abdominal Pain Associated symptoms: vaginal discharge   Associated symptoms: no chest pain, no chills, no constipation, no diarrhea, no dysuria, no fever, no hematuria, no nausea, no shortness of breath, no vaginal bleeding and no vomiting       Past Medical History:  Diagnosis Date   Anxiety    Headache    Infection    UTI   Prolactinoma ([place])    Vaginal Pap smear, abnormal    colpo, in process of eval    Patient Active Problem List   Diagnosis Date Noted   PROM (premature rupture of membranes) 02/12/2020   Group B Streptococcus carrier, +RV culture, currently pregnant 01/25/2020    Anemia in pregnancy 08/29/2019   Obesity in pregnancy 08/13/2019   Anxiety    Supervision of high risk pregnancy, antepartum 07/23/2019   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   Pap smear of cervix shows high risk HPV present 08/22/2018    Past Surgical History:  Procedure Laterality Date   NO PAST SURGERIES       OB History     Gravida  3   Para  2   Term  2   Preterm      AB  1   Living  2      SAB      IAB  1   Ectopic      Multiple  0   Live Births  2           Family History  Problem Relation Age of Onset   Vision loss Mother    Multiple sclerosis Mother    Hearing loss Maternal Uncle    Diabetes Maternal Grandmother    Cancer Paternal Grandmother    Thyroid disease Sister     Social History   Tobacco Use   Smoking status: Every Day    Packs/day: 0.25    Types: Cigarettes   Smokeless tobacco: Never   Tobacco comments:    quit with +preg test  Vaping Use   Vaping Use: Never used  Substance Use Topics   Alcohol use: Yes    Comment: occasionally   Drug use: No    Home Medications  Prior to Admission medications   Medication Sig Start Date End Date Taking? Authorizing Provider  metroNIDAZOLE (FLAGYL) 500 MG tablet Take 1 tablet (500 mg total) by mouth 2 (two) times daily. 06/28/21   White, Leitha Schuller, NP  amLODipine (NORVASC) 5 MG tablet Take 1 tablet (5 mg total) by mouth daily. 03/20/20 04/13/20  Rasch, Artist Pais, NP    Allergies    Patient has no known allergies.  Review of Systems   Review of Systems  Constitutional:  Negative for chills and fever.  Eyes:  Negative for visual disturbance.  Respiratory:  Negative for shortness of breath.   Cardiovascular:  Negative for chest pain.  Gastrointestinal:  Positive for abdominal pain. Negative for abdominal distention, anal bleeding, blood in stool, constipation, diarrhea, nausea, rectal pain and vomiting.  Genitourinary:  Positive for pelvic pain and vaginal discharge. Negative for difficulty  urinating, dysuria, flank pain, frequency, genital sores, hematuria, urgency, vaginal bleeding and vaginal pain.  Musculoskeletal:  Negative for back pain and neck pain.  Skin:  Negative for color change and rash.  Neurological:  Negative for dizziness, syncope, light-headedness and headaches.  Psychiatric/Behavioral:  Negative for confusion.    Physical Exam Updated Vital Signs BP (!) 103/59 (BP Location: Right Arm)   Pulse 77   Temp 98.7 F (37.1 C) (Oral)   Resp 16   Ht 5\' 4"  (1.626 m)   Wt 78.5 kg   LMP 08/07/2021 (Approximate)   SpO2 100%   BMI 29.70 kg/m   Physical Exam Vitals and nursing note reviewed. Exam conducted with a chaperone present (Female nurse tech present as chaperone).  Constitutional:      General: She is not in acute distress.    Appearance: She is not ill-appearing, toxic-appearing or diaphoretic.  HENT:     Head: Normocephalic.  Eyes:     General: No scleral icterus.       Right eye: No discharge.        Left eye: No discharge.  Cardiovascular:     Rate and Rhythm: Normal rate.  Pulmonary:     Effort: Pulmonary effort is normal.  Abdominal:     General: Bowel sounds are normal. There is no distension. There are no signs of injury.     Palpations: Abdomen is soft. There is no mass or pulsatile mass.     Tenderness: There is abdominal tenderness in the right lower quadrant. There is no right CVA tenderness, left CVA tenderness, guarding or rebound.     Hernia: There is no hernia in the left inguinal area or right inguinal area.  Genitourinary:    Exam position: Lithotomy position.     Pubic Area: No rash or pubic lice.      Tanner stage (genital): 5.     Labia:        Right: No rash, tenderness, lesion or injury.        Left: No rash, tenderness, lesion or injury.      Vagina: No signs of injury and foreign body. Vaginal discharge present. No erythema, tenderness, bleeding, lesions or prolapsed vaginal walls.     Cervix: Discharge present. No  cervical motion tenderness, friability, lesion, erythema, cervical bleeding or eversion.     Uterus: Not enlarged and not tender.      Adnexa:        Right: No mass, tenderness or fullness.         Left: No mass, tenderness or fullness.       Comments:  Copious amounts of white discharge noted to vaginal vault.  Discharge has foul odor. Lymphadenopathy:     Lower Body: No right inguinal adenopathy. No left inguinal adenopathy.  Skin:    General: Skin is warm and dry.  Neurological:     General: No focal deficit present.     Mental Status: She is alert.  Psychiatric:        Behavior: Behavior is cooperative.    ED Results / Procedures / Treatments   Labs (all labs ordered are listed, but only abnormal results are displayed) Labs Reviewed  WET PREP, GENITAL - Abnormal; Notable for the following components:      Result Value   WBC, Wet Prep HPF POC FEW (*)    All other components within normal limits  COMPREHENSIVE METABOLIC PANEL - Abnormal; Notable for the following components:   AST 8 (*)    All other components within normal limits  CBC - Abnormal; Notable for the following components:   RBC 3.80 (*)    Hemoglobin 11.4 (*)    HCT 33.3 (*)    All other components within normal limits  URINALYSIS, ROUTINE W REFLEX MICROSCOPIC - Abnormal; Notable for the following components:   Hgb urine dipstick SMALL (*)    Protein, ur TRACE (*)    Bacteria, UA RARE (*)    All other components within normal limits  LIPASE, BLOOD  PREGNANCY, URINE  RPR  HIV ANTIBODY (ROUTINE TESTING W REFLEX)  GC/CHLAMYDIA PROBE AMP ([place]) NOT AT [facility]    EKG None  Radiology CT ABDOMEN PELVIS W CONTRAST  Result Date: 08/27/2021 CLINICAL DATA:  Right lower quadrant abdominal pain. EXAM: CT ABDOMEN AND PELVIS WITH CONTRAST TECHNIQUE: Multidetector CT imaging of the abdomen and pelvis was performed using the standard protocol following bolus administration of intravenous contrast. CONTRAST:  155mL  OMNIPAQUE IOHEXOL 300 MG/ML  SOLN COMPARISON:  CT abdomen pelvis dated 12/26/2020. FINDINGS: Lower chest: The visualized bases are clear. No intra-abdominal free air or free fluid. Hepatobiliary: The liver is unremarkable. No intrahepatic biliary dilatation. The gallbladder is unremarkable. Pancreas: Unremarkable. No pancreatic ductal dilatation or surrounding inflammatory changes. Spleen: Normal in size without focal abnormality. Adrenals/Urinary Tract: The adrenal glands are unremarkable. The kidneys, visualized ureters, and urinary bladder appear unremarkable. Stomach/Bowel: There is no bowel obstruction or active inflammation. The appendix is not visualized with certainty. No inflammatory changes identified in the right lower quadrant. Vascular/Lymphatic: The abdominal aorta and IVC unremarkable. No portal venous gas. There is no adenopathy. Reproductive: The uterus is anteverted. There is a corpus luteum in the left ovary. The right ovary is unremarkable. Other: None Musculoskeletal: No acute or significant osseous findings. IMPRESSION: No acute intra-abdominal or pelvic pathology. Electronically Signed   By: Anner Crete M.D.   On: 08/27/2021 20:11    Procedures Procedures   Medications Ordered in ED Medications  morphine 4 MG/ML injection 4 mg (4 mg Intravenous Given 08/27/21 1821)  iohexol (OMNIPAQUE) 300 MG/ML solution 100 mL (100 mLs Intravenous Contrast Given 08/27/21 1956)  cefTRIAXone (ROCEPHIN) injection 500 mg (500 mg Intramuscular Given 08/27/21 2048)  lidocaine (PF) (XYLOCAINE) 1 % injection 1 mL (1 mL Other Given 08/27/21 2048)  doxycycline (VIBRA-TABS) tablet 100 mg (100 mg Oral Given 08/27/21 2047)    ED Course  I have reviewed the triage vital signs and the nursing notes.  Pertinent labs & imaging results that were available during my care of the patient were reviewed by me and considered in my medical  decision making (see chart for details).    MDM  Rules/Calculators/A&P                           Alert 30 year old female no acute distress, nontoxic-appearing.  Presents to ED with chief complaint of right lower quadrant and right pelvic pain.  Patient additionally endorses vaginal discharge over the last 4 days.  Patient has history of previous trichomonas infection.  Patient is sexually active with multiple partners, does not always use condoms for penetrative vaginal sex.  On exam abdomen soft, nondistended, tenderness to right lower quadrant and suprapubic tenderness.  No guarding or rebound tenderness.  CMP, CBC, lipase unremarkable. Urinalysis shows bacteria rare, squamous epithelial 11-20, WBC 0-5, RBC 0-5, leukocyte negative, nitrite negative.  Low suspicion for urinary tract infection at this time.  Due to right lower quadrant tenderness will obtain CT abdomen pelvis to evaluate for possible discitis.  Will perform pelvic exam to evaluate for possible PID.  Plan to swab patient for gonorrhea/chlamydia as well as wet prep.  Patient elects for HIV testing and syphilis testing.  Discussed that if positive patient will need to follow-up with PCP, health department, or urgent care for treatment of HIV and/or syphilis.  Pelvic exam shows copious amounts of white discharge to vaginal vault.  Patient elects for empiric treatment of gonorrhea and chlamydia.  Wet prep negative for bacterial vaginosis, trichomonas, or yeast infection.  No cervical motion tenderness, tenderness to uterus or adnexal tender tenderness.  Low suspicion for PID or cervicitis at this time.  CT imaging shows no acute intra-abdominal or pelvic pathology.  Patient reports improvement in symptoms after receiving morphine.  Will discharge patient with course of doxycycline.  Patient to follow-up with PCP if symptoms do not improve.  Discussed results, findings, treatment and follow up. Patient advised of return precautions. Patient verbalized understanding and agreed with  plan.   Final Clinical Impression(s) / ED Diagnoses Final diagnoses:  Pelvic pain  Vaginal discharge    Rx / DC Orders ED Discharge Orders          Ordered    doxycycline (VIBRA-TABS) 100 MG tablet  2 times daily        08/27/21 2041             Loni Beckwith, PA-C 08/28/21 0035    Fredia Sorrow, MD 09/04/21 [id number]

## 2021-08-27 NOTE — Telephone Encounter (Signed)
Pt contacted regarding her labs. Lab appt scheduled.

## 2021-08-27 NOTE — Discharge Instructions (Addendum)
Today you have been treated for gonorrhea and chlamydia.  The test to determine if you have these will take a few days. They will only call you if your tests come back positive, no news is good news. In the result that your tests are positive you have already been treated.  You also have tests pending for HIV and syphilis.  You should receive a call if these results are positive.  On your discharge information there is also instructions on how to sign up for my chart.  Please complete this so that you can follow the results on your own also.   Please do not have any sexual activity for the next two weeks.  After that please make sure that you always use a condom every time you have sex.  If you have any new or concerning symptoms please seek additional medical care and evaluation.    You may have diarrhea from the antibiotics.  It is very important that you continue to take the antibiotics even if you get diarrhea unless a medical professional tells you that you may stop taking them.  If you stop too early the bacteria you are being treated for will become stronger and you may need different, more powerful antibiotics that have more side effects and worsening diarrhea.  Please stay well hydrated and consider probiotics as they may decrease the severity of your diarrhea.  Please be aware that if you take any hormonal contraception (birth control pills, nexplanon, the ring, etc) that your birth control will not work while you are taking antibiotics and you need to use back up protection as directed on the birth control medication information insert.   Get help right away if: You have very bad pain in your belly, and medicine does not help it. You cannot pee.

## 2021-08-27 NOTE — ED Triage Notes (Signed)
Pt via pov from home with rlq abdominal pain x 4 days. Pt states she did have some discharge at first with some odor, but that has resolved. Pt reports that the pain makes it difficult to walk. Pt states she sometimes has pain like this related to her menstrual cycle but that it doesn't last this long. Pt alert & oriented, nad noted.

## 2021-08-27 NOTE — Telephone Encounter (Signed)
Please let the pt know that her prolactin in normal at this time and if she has not had Cabergoline in a month, I would suggest staying off  of it for now    I would like for her to have it checked again in 2 months though    Please schedule the pt for labs only in 2 months     Thanks

## 2021-08-28 LAB — RPR: RPR Ser Ql: NONREACTIVE

## 2021-08-28 LAB — GC/CHLAMYDIA PROBE AMP (~~LOC~~) NOT AT ARMC
Chlamydia: NEGATIVE
Comment: NEGATIVE
Comment: NORMAL
Neisseria Gonorrhea: NEGATIVE

## 2021-08-28 LAB — HIV ANTIBODY (ROUTINE TESTING W REFLEX): HIV Screen 4th Generation wRfx: NONREACTIVE

## 2021-09-19 ENCOUNTER — Ambulatory Visit
Admission: RE | Admit: 2021-09-19 | Discharge: 2021-09-19 | Disposition: A | Discharge status: Home / Self Care | Payer: Medicaid Other | Source: Ambulatory Visit | Attending: Internal Medicine | Admitting: Internal Medicine

## 2021-09-19 ENCOUNTER — Other Ambulatory Visit: Payer: Self-pay

## 2021-09-19 DIAGNOSIS — D352 Benign neoplasm of pituitary gland: Secondary | ICD-10-CM

## 2021-09-19 MED ORDER — GADOBENATE DIMEGLUMINE 529 MG/ML IV SOLN
8.0000 mL | Freq: Once | INTRAVENOUS | Status: AC | PRN
  Administered 2021-09-19: 8 mL via INTRAVENOUS

## 2021-09-23 ENCOUNTER — Telehealth: Payer: Self-pay | Admitting: Internal Medicine

## 2021-09-23 DIAGNOSIS — D352 Benign neoplasm of pituitary gland: Secondary | ICD-10-CM

## 2021-09-23 NOTE — Telephone Encounter (Signed)
Patient notified and verbalized understanding. 

## 2021-09-23 NOTE — Telephone Encounter (Signed)
PLease let the pt know that her MRI shows a large pituitary tumor. This is the first MRI she gets done at our system so I don't know how larger it was in the past.    In the meantime, I need for recheck her prolactin level ( please schedule her for a lab appointment ) next week   Based on that will determine  the next step    Thanks

## 2021-09-28 ENCOUNTER — Other Ambulatory Visit (INDEPENDENT_AMBULATORY_CARE_PROVIDER_SITE_OTHER): Payer: Medicaid Other

## 2021-09-28 ENCOUNTER — Other Ambulatory Visit: Payer: Self-pay

## 2021-09-28 DIAGNOSIS — D352 Benign neoplasm of pituitary gland: Secondary | ICD-10-CM

## 2021-09-28 LAB — HCG, SERUM, QUALITATIVE: Preg, Serum: NEGATIVE

## 2021-09-28 LAB — PROLACTIN: Prolactin: 58 ng/mL — ABNORMAL HIGH

## 2021-09-30 ENCOUNTER — Telehealth: Payer: Self-pay | Admitting: Internal Medicine

## 2021-09-30 DIAGNOSIS — D352 Benign neoplasm of pituitary gland: Secondary | ICD-10-CM | POA: Insufficient documentation

## 2021-09-30 MED ORDER — CABERGOLINE 0.5 MG PO TABS: 0.5000 mg | ORAL_TABLET | ORAL | 2 refills | Status: AC

## 2021-09-30 MED ORDER — CABERGOLINE 0.5 MG PO TABS: 0.2500 mg | ORAL_TABLET | ORAL | 2 refills | Status: DC

## 2021-09-30 NOTE — Telephone Encounter (Signed)
Please let the patient know that I have put in a referral for her to see neurosurgery.  Her pituitary tumor is quite large and needs to be evaluated   Her prolactin level is slightly elevated I will start her on cabergoline ONE tablet at [facility] (not twice a week as she used to)   Please asked the patient if she is able to get her old records from [place], sometimes if she can login to and or MyChart she can find records just to determine if this tumor has been the same size or did not get bigger?   Thanks

## 2021-10-01 NOTE — Telephone Encounter (Signed)
Patient notified and will try to get records

## 2021-10-04 ENCOUNTER — Other Ambulatory Visit: Payer: Self-pay

## 2021-10-04 ENCOUNTER — Encounter (HOSPITAL_BASED_OUTPATIENT_CLINIC_OR_DEPARTMENT_OTHER): Payer: Self-pay

## 2021-10-04 ENCOUNTER — Emergency Department (HOSPITAL_BASED_OUTPATIENT_CLINIC_OR_DEPARTMENT_OTHER)
Admission: EM | Admit: 2021-10-04 | Discharge: 2021-10-04 | Disposition: A | Discharge status: Home / Self Care | Payer: Medicaid Other | Attending: Emergency Medicine | Admitting: Emergency Medicine

## 2021-10-04 DIAGNOSIS — R300 Dysuria: Secondary | ICD-10-CM | POA: Insufficient documentation

## 2021-10-04 DIAGNOSIS — F17210 Nicotine dependence, cigarettes, uncomplicated: Secondary | ICD-10-CM | POA: Insufficient documentation

## 2021-10-04 LAB — PREGNANCY, URINE: Preg Test, Ur: NEGATIVE

## 2021-10-04 LAB — URINALYSIS, ROUTINE W REFLEX MICROSCOPIC
Bilirubin Urine: NEGATIVE
Glucose, UA: NEGATIVE mg/dL
Ketones, ur: NEGATIVE mg/dL
Nitrite: NEGATIVE
Protein, ur: NEGATIVE mg/dL
Specific Gravity, Urine: 1.019 (ref 1.005–1.030)
pH: 6 (ref 5.0–8.0)

## 2021-10-04 NOTE — ED Provider Notes (Signed)
[facility] Provider Note  CSN: [id number] Arrival date & time: 10/04/21 1607    History Chief Complaint  Patient presents with   Dysuria    Patricia Morales is a 30 y.o. female with history of prolactinoma managed by endocrine reports about 2 weeks ago she went to [facility] for vaginal discharge. They tested her urine and did vaginal swabs. She was told her vaginal swab was positive for yeast but there was no UTI. She was given Rx for Diflucan. She also reports they called her back and told her GC/CT were also negative. She is on her period now but denies any further vaginal discharge. She is concerned because her urine has an ammonia odor to it but she is not having any dysuria. She denies nausea, vomiting, flank pain or fever.    Past Medical History:  Diagnosis Date   Anxiety    Headache    Infection    UTI   Prolactinoma ([place])    Vaginal Pap smear, abnormal    colpo, in process of eval    Past Surgical History:  Procedure Laterality Date   NO PAST SURGERIES      Family History  Problem Relation Age of Onset   Vision loss Mother    Multiple sclerosis Mother    Hearing loss Maternal Uncle    Diabetes Maternal Grandmother    Cancer Paternal Grandmother    Thyroid disease Sister     Social History   Tobacco Use   Smoking status: Every Day    Packs/day: 0.25    Types: Cigarettes   Smokeless tobacco: Never   Tobacco comments:    quit with +preg test  Vaping Use   Vaping Use: Never used  Substance Use Topics   Alcohol use: Yes    Comment: occasionally   Drug use: No     Home Medications Prior to Admission medications   Medication Sig Start Date End Date Taking? Authorizing Provider  cabergoline (DOSTINEX) 0.5 MG tablet Take 1 tablet (0.5 mg total) by mouth once a week. 09/30/21   Shamleffer, Melanie Crazier, MD  metroNIDAZOLE (FLAGYL) 500 MG tablet Take 1 tablet (500 mg total) by mouth 2 (two) times daily.  06/28/21   White, Leitha Schuller, NP  amLODipine (NORVASC) 5 MG tablet Take 1 tablet (5 mg total) by mouth daily. 03/20/20 04/13/20  Rasch, Artist Pais, NP     Allergies    Patient has no known allergies.   Review of Systems   Review of Systems A comprehensive review of systems was completed and negative except as noted in HPI.    Physical Exam BP 106/77 (BP Location: Right Arm)    Pulse 75    Temp 98.4 F (36.9 C) (Oral)    Resp 16    LMP 10/02/2021 (Exact Date)    SpO2 100%   Physical Exam Vitals and nursing note reviewed.  Constitutional:      Appearance: Normal appearance.  HENT:     Head: Normocephalic and atraumatic.     Nose: Nose normal.     Mouth/Throat:     Mouth: Mucous membranes are moist.  Eyes:     Extraocular Movements: Extraocular movements intact.     Conjunctiva/sclera: Conjunctivae normal.  Cardiovascular:     Rate and Rhythm: Normal rate.  Pulmonary:     Effort: Pulmonary effort is normal.     Breath sounds: Normal breath sounds.  Abdominal:     General: Abdomen is flat.  Palpations: Abdomen is soft.     Tenderness: There is no abdominal tenderness.  Musculoskeletal:        General: No swelling. Normal range of motion.     Cervical back: Neck supple.  Skin:    General: Skin is warm and dry.  Neurological:     General: No focal deficit present.     Mental Status: She is alert.  Psychiatric:        Mood and Affect: Mood normal.     ED Results / Procedures / Treatments   Labs (all labs ordered are listed, but only abnormal results are displayed) Labs Reviewed  URINALYSIS, [place], URINE    EKG None   Radiology No results found.  Procedures Procedures  Medications Ordered in the ED Medications - No data to display   MDM Rules/Calculators/A&P MDM Patient here with urine odor she describes as ammonia. No other urinary symptoms. She does not have any current vaginal discharge and was recently tested neg  for STI by her report. Will check UA today.   ED Course  I have reviewed the triage vital signs and the nursing notes.  Pertinent labs & imaging results that were available during my care of the patient were reviewed by me and considered in my medical decision making (see chart for details).  Clinical Course as of 10/04/21 1844  Sun Oct 04, 2021  1718 HCG is neg.  [CS]  5456 UA is not convincing for infection. Will send for culture per patient's request. Otherwise recommend PCP follow up.  [CS]    Clinical Course User Index [CS] Truddie Hidden, MD    Final Clinical Impression(s) / ED Diagnoses Final diagnoses:  None    Rx / DC Orders ED Discharge Orders     None        Truddie Hidden, MD 10/04/21 1844

## 2021-10-04 NOTE — ED Triage Notes (Signed)
She c/o recent uti sx. She states she was recently seen at Urgent Care, who did not find evidence of uti and rx with Diflucan (only). She tells me she is currently on her period and has continued dysuria and "just not feeling right" at bladder/pelvic area.

## 2021-10-06 LAB — URINE CULTURE: Culture: >=100000 — AB

## 2021-10-06 NOTE — Telephone Encounter (Signed)
Error

## 2021-10-07 ENCOUNTER — Telehealth (HOSPITAL_BASED_OUTPATIENT_CLINIC_OR_DEPARTMENT_OTHER): Payer: Self-pay | Admitting: *Deleted

## 2021-10-07 NOTE — Progress Notes (Signed)
ED Antimicrobial Stewardship Positive Culture Follow Up   Patricia Morales is an 30 y.o. female who presented to [facility] on 10/04/2021 with a chief complaint of  Chief Complaint  Patient presents with   Dysuria    Recent Results (from the past 720 hour(s))  Urine Culture     Status: Abnormal   Collection Time: 10/04/21  6:01 PM   Specimen: Urine, Clean Catch  Result Value Ref Range Status   Specimen Description   Final    URINE, CLEAN CATCH Performed at [facility], [address], [place], [place] [address]    Special Requests   Final    NONE Performed at [facility], [address] [place], [place] [address]    Culture >=100,000 COLONIES/mL KLEBSIELLA PNEUMONIAE (A)  Final   Report Status 10/06/2021 FINAL  Final   Organism ID, Bacteria KLEBSIELLA PNEUMONIAE (A)  Final      Susceptibility   Klebsiella pneumoniae - MIC*    AMPICILLIN >=32 RESISTANT Resistant     CEFAZOLIN <=4 SENSITIVE Sensitive     CEFEPIME <=0.12 SENSITIVE Sensitive     CEFTRIAXONE <=0.25 SENSITIVE Sensitive     CIPROFLOXACIN <=0.25 SENSITIVE Sensitive     GENTAMICIN <=1 SENSITIVE Sensitive     IMIPENEM <=0.25 SENSITIVE Sensitive     NITROFURANTOIN 128 RESISTANT Resistant     TRIMETH/SULFA <=20 SENSITIVE Sensitive     AMPICILLIN/SULBACTAM 4 SENSITIVE Sensitive     PIP/TAZO <=4 SENSITIVE Sensitive     * >=100,000 COLONIES/mL KLEBSIELLA PNEUMONIAE     Plan: Call for sx check, if doing well no treatment indicated, if symptomatic for UTI tx with Keflex  New antibiotic prescription: Keflex  ED Provider: Charmaine Downs, PA-C   Patricia Morales 10/07/2021, 2:43 PM Clinical Pharmacist Monday - Friday phone -  [phone] Saturday - Sunday phone - [phone]

## 2021-10-07 NOTE — Telephone Encounter (Signed)
Post ED Visit - Positive Culture Follow-up: Unsuccessful Patient Follow-up  Culture assessed and recommendations reviewed by:  []  Elenor Quinones, Pharm.D. []  Heide Guile, Pharm.D., BCPS AQ-ID []  Parks Neptune, Pharm.D., BCPS []  Alycia Rossetti, Pharm.D., BCPS []  [place], Pharm.D., BCPS, AAHIVP []  Legrand Como, Pharm.D., BCPS, AAHIVP []  Wynell Balloon, PharmD [x]  Linus Salmons, PharmD, BCPS  Positive urine culture  [x]  Patient discharged without antimicrobial prescription and treatment is now indicated []  Organism is resistant to prescribed ED discharge antimicrobial []  Patient with positive blood cultures   Unable to contact patient  by phone voicemail box full, letter will be sent to address on file  Plan: If any symptoms- Keflex 500mg  TID x 5 days per Charmaine Downs, PA-C  Hart Carwin [place] 10/07/2021, 11:28 AM

## 2021-10-14 ENCOUNTER — Telehealth: Payer: Self-pay | Admitting: Emergency Medicine

## 2021-10-14 NOTE — Telephone Encounter (Signed)
Post ED Visit - Positive Culture Follow-up: Successful Patient Follow-Up  Culture assessed and recommendations reviewed by:  []  Elenor Quinones, Pharm.D. []  Heide Guile, Pharm.D., BCPS AQ-ID []  Parks Neptune, Pharm.D., BCPS []  Alycia Rossetti, Pharm.D., BCPS []  [place], [place].D., BCPS, AAHIVP []  Legrand Como, Pharm.D., BCPS, AAHIVP []  Salome Arnt, PharmD, BCPS []  Johnnette Gourd, PharmD, BCPS []  Hughes Better, PharmD, BCPS [x]  Bertis Ruddy, PharmD  Positive urine culture  [x]  Patient discharged without antimicrobial prescription and treatment is now indicated []  Organism is resistant to prescribed ED discharge antimicrobial []  Patient with positive blood cultures  Changes discussed with ED provider: Charmaine Downs, PA New antibiotic prescription Keflex 500 mg PO TID for five days Called to [employer] ([place]) [phone]  Contacted patient, date 10/14/2021, time [place] 10/14/2021, 10:37 AM

## 2021-10-20 ENCOUNTER — Other Ambulatory Visit: Payer: Self-pay | Admitting: Internal Medicine

## 2021-10-20 DIAGNOSIS — D352 Benign neoplasm of pituitary gland: Secondary | ICD-10-CM

## 2021-10-22 ENCOUNTER — Other Ambulatory Visit: Payer: Self-pay

## 2021-10-22 ENCOUNTER — Other Ambulatory Visit: Payer: Medicaid Other

## 2021-10-22 DIAGNOSIS — D352 Benign neoplasm of pituitary gland: Secondary | ICD-10-CM | POA: Diagnosis not present

## 2021-10-23 LAB — PROLACTIN: Prolactin: 107.7 ng/mL — ABNORMAL HIGH

## 2021-11-27 ENCOUNTER — Ambulatory Visit: Payer: Medicaid Other | Admitting: Internal Medicine

## 2021-11-27 NOTE — Progress Notes (Deleted)
Name: Patricia Morales  MRN/ DOB: [id number], 1991/01/13    Age/ Sex: 31 y.o., female     PCP: Vassie Moment, MD   Reason for Endocrinology Evaluation: Prolactinoma      Initial Endocrinology Clinic Visit: 04/23/2019    PATIENT IDENTIFIER: Patricia Morales is a 31 y.o., female with a past medical history of Prolactinoma, Abnormal pap and hx of drug abuse. She has followed with [facility] since 04/23/2019 for consultative assistance with management of her prolactinoma   HISTORICAL SUMMARY:  She was diagnosed with prolactinoma in 2012 while living in [place], this was detected during evaluation for secondary amenorrhea. There pt has chronic history of non-compliance. She did not have any medication evaluation for approximately 2-3 yrs prior to her presentation to the [facility] in 03/2019    PCP unable to obtain records from her endocrinologist in [place].    She was on Carbegoline during initial diagnosis,but during pregnancy she was switched to bromocriptine with reported intolerance. On her initial presentation to our clinic she had been on Cabergoline since 07/2018 with intermittent compliance, but by her second visit she was pregnant at [redacted] weeks and had stopped the cabergoline.     She had a brain MRI scheduled for 05/29/19 but she did not show up. I ordered it again in 2021 but she again did not have this done , she then  became pregnant and she had her MRI 09/19/2021  SUBJECTIVE:   Today (11/27/21):  Patricia Morales is here for a  follow up on prolactinoma.   She has had multiple ED visit for STD's and vaginitis    She tells me she continues to take Cabergoline 1 tablet twice a week   Denies headaches  Denies vision changes  No contraception  Denies galactorrhea  LMP  08/07/2021      ENDOCRINE HOME MEDICATIONS: Cabergoline 0.5 mg, 1 tablet TWICE weekly        HISTORY:  Past Medical History:  Past Medical History:   Diagnosis Date   Anxiety    Headache    Infection    UTI   Prolactinoma ([place])    Vaginal Pap smear, abnormal    colpo, in process of eval   Past Surgical History:  Past Surgical History:  Procedure Laterality Date   NO PAST SURGERIES     Social History:  reports that she has been smoking cigarettes. She has been smoking an average of .25 packs per day. She has never used smokeless tobacco. She reports current alcohol use. She reports that she does not use drugs. Family History:  Family History  Problem Relation Age of Onset   Vision loss Mother    Multiple sclerosis Mother    Hearing loss Maternal Uncle    Diabetes Maternal Grandmother    Cancer Paternal Grandmother    Thyroid disease Sister      HOME MEDICATIONS: Allergies as of 11/27/2021   No Known Allergies      Medication List        Accurate as of November 27, 2021  8:07 AM. If you have any questions, ask your nurse or doctor.          cabergoline 0.5 MG tablet Commonly known as: DOSTINEX Take 1 tablet (0.5 mg total) by mouth once a week.   metroNIDAZOLE 500 MG tablet Commonly known as: FLAGYL Take 1 tablet (500 mg total) by mouth 2 (two) times daily.  OBJECTIVE:   PHYSICAL EXAM: VS: There were no vitals taken for this visit.   EXAM: General: Pt appears well and is in NAD  Lungs: Clear with good BS bilat with no rales, rhonchi, or wheezes  Heart: Auscultation: RRR.  Abdomen: Normoactive bowel sounds, soft, nontender, without masses or organomegaly palpable  Extremities:  BL LE: No pretibial edema normal ROM and strength.  Mental Status: Judgment, insight: Intact Orientation: Oriented to time, place, and person Mood and affect: No depression, anxiety, or agitation     DATA REVIEWED: Results for LAAIBAH, WARTMAN (MRN [id number]) as of 08/27/2021 10:21  Ref. Range 08/26/2021 15:43  Prolactin Latest Units: ng/mL 10.2  Preg, Serum Unknown NEGATIVE     03/26/2019   Prolactin 759.1  ng/mL      07/14/2018 TSH 0.905 uIU/mL  LH 6.3 miu/mL FSH 5.7 miu/mL  Prolactin 584.1 ng/mL     MRI brain 09/19/2021  Pituitary/Sella: The sella is enlarged with concave superior contour. Hypoenhancing tissue is seen in the inferior aspect of the sella on the left side, extending to the left cavernous sinus, more evident on the dynamic post-contrast sequence (series 15, image 8). There is irregular contour of the sellar floor she suggesting extension of the tumor into the left sphenoid sinus. The lesion measures approximately 1.7 x 1.3 x 0.6 cm.  ASSESSMENT / PLAN / RECOMMENDATIONS:   Prolactinoma:    - Diagnosed in 2012. Unknown prolactinoma size. 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 I have encouraged the pt to get this done  - Intolerant to Bromocriptine  - Prolactin is normal despite being off cabergoline for a month, will not restart , will recheck in 8 weeks    Medications :  Cabergoline 0.5 mg, 1 tablet TWICE weekly    F/U in 3 months   Signed electronically by: Mack Guise, MD  [facility]  [facility] [address] [place], [place] [address] Phone: [phone] FAX: [phone]      CC: Vassie Moment, MD [place] [address] Phone: [phone]  Fax: [phone]   Return to Endocrinology clinic as below: Future Appointments  Date Time Provider [place]  11/27/2021  3:40 PM Haaris Metallo, Melanie Crazier, MD [id number] None

## 2021-12-09 ENCOUNTER — Ambulatory Visit (HOSPITAL_COMMUNITY)
Admission: EM | Admit: 2021-12-09 | Discharge: 2021-12-09 | Disposition: A | Discharge status: Home / Self Care | Payer: Medicaid Other | Attending: Family Medicine | Admitting: Family Medicine

## 2021-12-09 ENCOUNTER — Encounter (HOSPITAL_COMMUNITY): Payer: Self-pay | Admitting: Emergency Medicine

## 2021-12-09 ENCOUNTER — Other Ambulatory Visit: Payer: Self-pay

## 2021-12-09 DIAGNOSIS — M25512 Pain in left shoulder: Secondary | ICD-10-CM

## 2021-12-09 MED ORDER — PREDNISONE 10 MG PO TABS: ORAL_TABLET | ORAL | 0 refills | Status: AC

## 2021-12-09 NOTE — ED Triage Notes (Signed)
Pt c/o left shoulder pain for couple days. Denies injury or lifting.

## 2021-12-09 NOTE — ED Provider Notes (Signed)
[facility]    CSN: [id number] Arrival date & time: 12/09/21  1207      History   Chief Complaint Chief Complaint  Patient presents with   Shoulder Pain    HPI Patricia Morales is a 31 y.o. female.   Left shoulder pain Started suddenly yesterday and has progressively worsened Denies any changes in recent activity Does state that she has a small child but does not lift them very often States that she worked out this week, but nothing out of the ordinary No specific injury or event where she felt a pop States the pain is mostly in the anterior aspect of her left shoulder She is right-hand dominant She is currently in truck driving school and states that driving with her left arm has made the pain worse No numbness and tingling Starting to have some posterior pain now that the pain is worsening States that she finds it difficult to lift her arm secondary to the pain Has never had any prior left shoulder pain Otherwise in her usual state of health No fevers   Past Medical History:  Diagnosis Date   Anxiety    Headache    Infection    UTI   Prolactinoma ([place])    Vaginal Pap smear, abnormal    colpo, in process of eval    Patient Active Problem List   Diagnosis Date Noted   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   PROM (premature rupture of membranes) 02/12/2020   Group B Streptococcus carrier, +RV culture, currently pregnant 01/25/2020   Anemia in pregnancy 08/29/2019   Obesity in pregnancy 08/13/2019   Anxiety    Supervision of high risk pregnancy, antepartum 07/23/2019   Prolactinoma ([place]) 07/23/2019   Pap smear of cervix shows high risk HPV present 08/22/2018    Past Surgical History:  Procedure Laterality Date   NO PAST SURGERIES      OB History     Gravida  3   Para  2   Term  2   Preterm      AB  1   Living  2      SAB      IAB  1   Ectopic      Multiple  0   Live Births  2            Home Medications     Prior to Admission medications   Medication Sig Start Date End Date Taking? Authorizing Provider  predniSONE (DELTASONE) 10 MG tablet Take 6 tablets (60 mg total) by mouth daily for 1 day, THEN 5 tablets (50 mg total) daily for 1 day, THEN 4 tablets (40 mg total) daily for 1 day, THEN 3 tablets (30 mg total) daily for 1 day, THEN 2 tablets (20 mg total) daily for 1 day, THEN 1 tablet (10 mg total) daily for 1 day. 12/09/21 12/15/21 Yes Tangelia Sanson, Bernita Raisin, DO  cabergoline (DOSTINEX) 0.5 MG tablet Take 1 tablet (0.5 mg total) by mouth once a week. 09/30/21   Shamleffer, Melanie Crazier, MD  metroNIDAZOLE (FLAGYL) 500 MG tablet Take 1 tablet (500 mg total) by mouth 2 (two) times daily. 06/28/21   White, Leitha Schuller, NP  amLODipine (NORVASC) 5 MG tablet Take 1 tablet (5 mg total) by mouth daily. 03/20/20 04/13/20  Rasch, Artist Pais, NP    Family History Family History  Problem Relation Age of Onset   Vision loss Mother    Multiple sclerosis Mother    Hearing loss  Maternal Uncle    Diabetes Maternal Grandmother    Cancer Paternal Grandmother    Thyroid disease Sister     Social History Social History   Tobacco Use   Smoking status: Every Day    Packs/day: 0.25    Types: Cigarettes   Smokeless tobacco: Never   Tobacco comments:    quit with +preg test  Vaping Use   Vaping Use: Never used  Substance Use Topics   Alcohol use: Yes    Comment: occasionally   Drug use: No     Allergies   Patient has no known allergies.   Review of Systems Review of Systems  All other systems reviewed and are negative.  Per HPI Physical Exam Triage Vital Signs ED Triage Vitals  Enc Vitals Group     BP      Pulse      Resp      Temp      Temp src      SpO2      Weight      Height      Head Circumference      Peak Flow      Pain Score      Pain Loc      Pain Edu?      Excl. in [place]?    No data found.  Updated Vital Signs BP 100/62 (BP Location: Right Arm)    Pulse 89    Temp 98.5  F (36.9 C) (Oral)    Resp 17    LMP 11/03/2021    SpO2 98%   Visual Acuity Right Eye Distance:   Left Eye Distance:   Bilateral Distance:    Right Eye Near:   Left Eye Near:    Bilateral Near:     Physical Exam Constitutional:      General: She is not in acute distress.    Appearance: Normal appearance. She is not ill-appearing.  HENT:     Head: Normocephalic and atraumatic.  Eyes:     Conjunctiva/sclera: Conjunctivae normal.  Cardiovascular:     Rate and Rhythm: Normal rate.  Pulmonary:     Effort: Pulmonary effort is normal. No respiratory distress.  Musculoskeletal:     Cervical back: Normal range of motion.     Comments: Left shoulder: Inspection reveals no obvious deformity, atrophy, or asymmetry b/l. No bruising. No swelling Palpation is normal with no TTP over [facility] joint or bicipital groove b/l. Full ROM in flexion, abduction with pain at the extreme on left, external rotation b/l, internal rotation on the left to back pocket with pain NV intact distally b/l Special Tests:  - Impingement: Positive Hawkins - Supraspinatous: Negative empty can - Infraspinatous/Teres Minor: 5/5 strength with ER - Subscapularis: 4/5 strength with IR with significant worsening of pain - Biceps tendon: Negative Speeds - Labrum: Negative Obriens, negative clunk, good stability    Skin:    General: Skin is warm and dry.  Neurological:     Mental Status: She is alert and oriented to person, place, and time.  Psychiatric:        Mood and Affect: Mood normal.        Behavior: Behavior normal.     UC Treatments / Results  Labs (all labs ordered are listed, but only abnormal results are displayed) Labs Reviewed - No data to display  EKG   Radiology No results found.  Procedures Procedures (including critical care time)  Medications Ordered in UC Medications - No  data to display  Initial Impression / Assessment and Plan / UC Course  I have reviewed the triage vital signs and  the nursing notes.  Pertinent labs & imaging results that were available during my care of the patient were reviewed by me and considered in my medical decision making (see chart for details).     Exam and findings most consistent with rotator cuff pathology, specifically within the subscapularis.  No specific injury, could consider overuse in the setting of now going to truck driving school.  No indication for x-ray at this time.  She declines injection today, would start with a prednisone Dosepak given her significant pain and she is rather tearful during the examination.  We will have her follow-up with sports medicine in 1 week, reiterated that this is very important to ensure that there is no significant tearing noted within her rotator cuff.  Patient denies chance of pregnancy and reports negative test yesterday.   Final Clinical Impressions(s) / UC Diagnoses   Final diagnoses:  Acute pain of left shoulder     Discharge Instructions      As we discussed, the pain in your shoulder is likely related to some overuse in regards to your rotator cuff which is the main stabilizing tendons in your shoulder.  I am concerned that you have torn 1 of these tendons, so it is important that you please follow-up with the sports medicine office next week for further imaging.  I have sent in a prescription for prednisone for you to take for the pain.  Make sure you take this with food and try to take it first thing in the morning.  Limit the use of your left shoulder at work.  Have your arm hang down and perform circles when it starts to feel slightly better, hopefully tomorrow.  This will help you keep your range of motion.     ED Prescriptions     Medication Sig Dispense Auth. Provider   predniSONE (DELTASONE) 10 MG tablet Take 6 tablets (60 mg total) by mouth daily for 1 day, THEN 5 tablets (50 mg total) daily for 1 day, THEN 4 tablets (40 mg total) daily for 1 day, THEN 3 tablets (30 mg total)  daily for 1 day, THEN 2 tablets (20 mg total) daily for 1 day, THEN 1 tablet (10 mg total) daily for 1 day. 21 tablet Loran Auguste, Bernita Raisin, DO      PDMP not reviewed this encounter.   Saleh Ulbrich, Bernita Raisin, DO 12/09/21 1253

## 2021-12-09 NOTE — Discharge Instructions (Signed)
As we discussed, the pain in your shoulder is likely related to some overuse in regards to your rotator cuff which is the main stabilizing tendons in your shoulder.  I am concerned that you have torn 1 of these tendons, so it is important that you please follow-up with the sports medicine office next week for further imaging.  I have sent in a prescription for prednisone for you to take for the pain.  Make sure you take this with food and try to take it first thing in the morning.  Limit the use of your left shoulder at work.  Have your arm hang down and perform circles when it starts to feel slightly better, hopefully tomorrow.  This will help you keep your range of motion.

## 2021-12-14 ENCOUNTER — Ambulatory Visit: Payer: Medicaid Other | Admitting: Family Medicine

## 2021-12-18 IMAGING — US US MFM OB FOLLOW-UP
1 series · 13 of 28 positions shown · non-contrast
Comparison: none

[Series 1: us mfm ob follow-up · 13 of 53 slices shown]
[im 2/53]
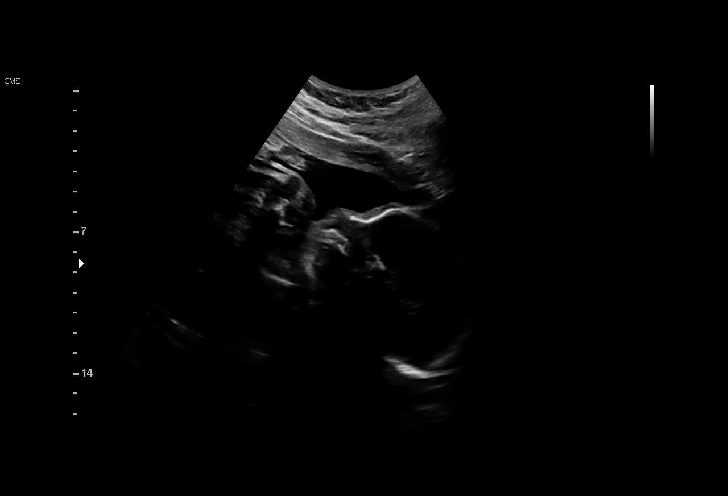
[im 6/53]
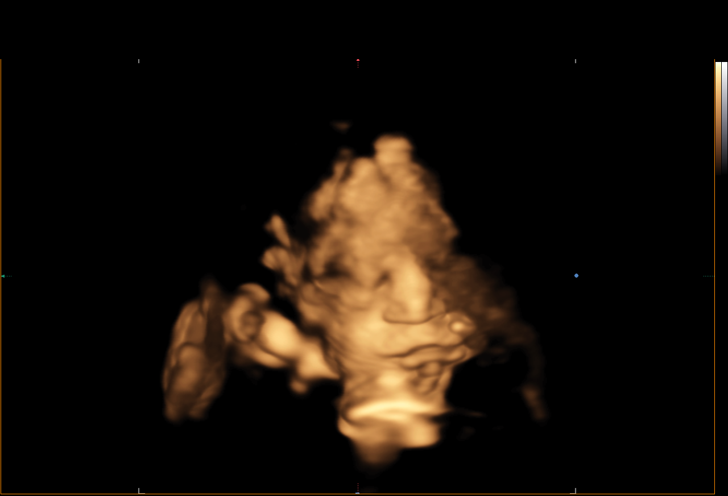
[im 10/53]
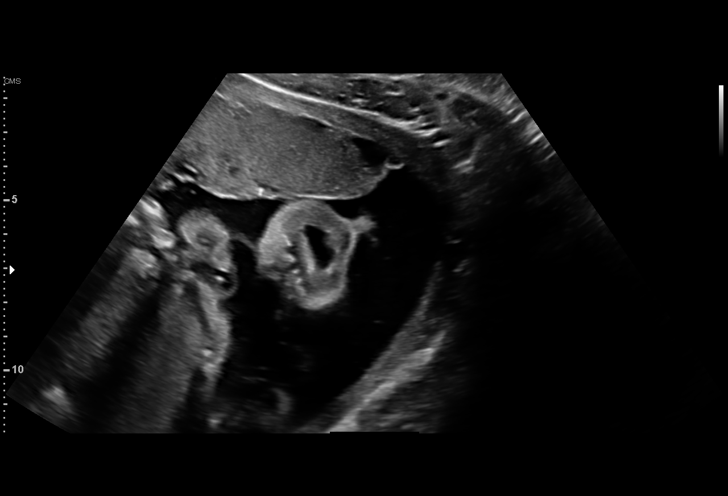
[im 14/53]
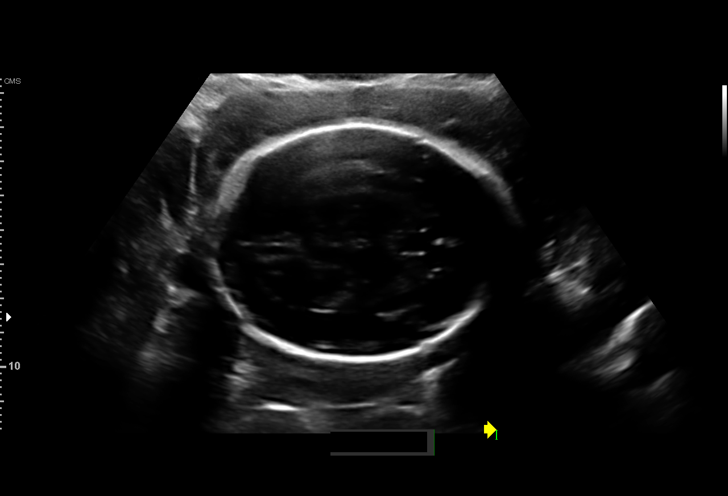
[im 18/53]
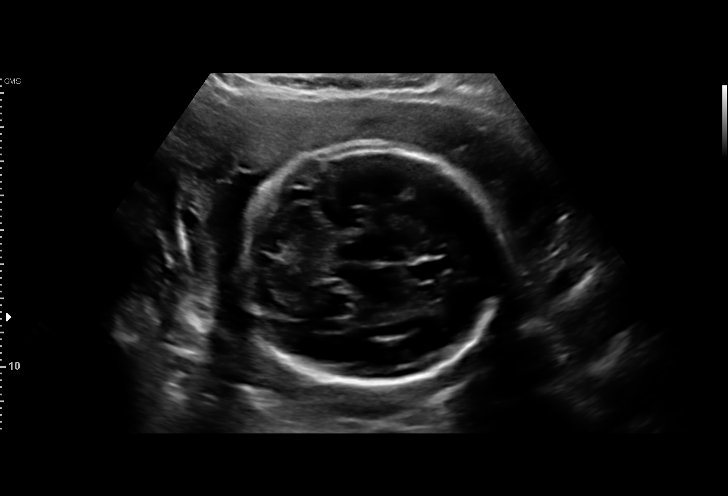
[im 22/53]
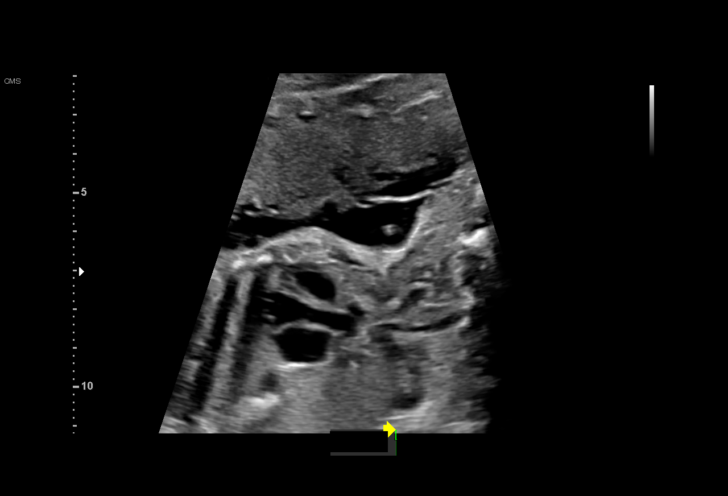
[im 27/53]
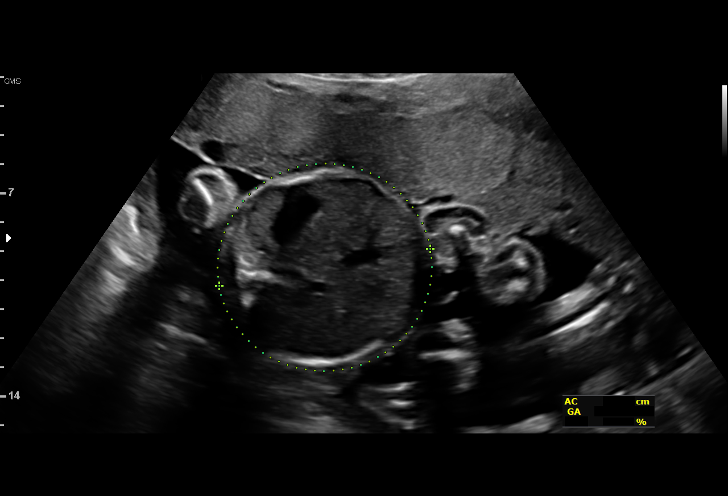
[im 31/53]
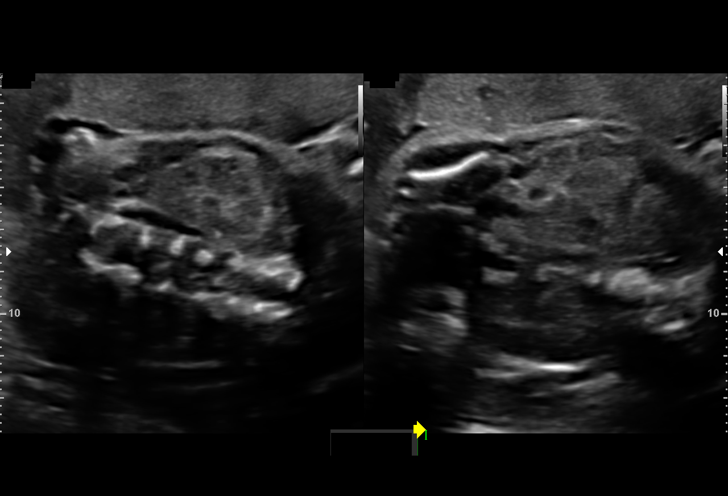
[im 35/53]
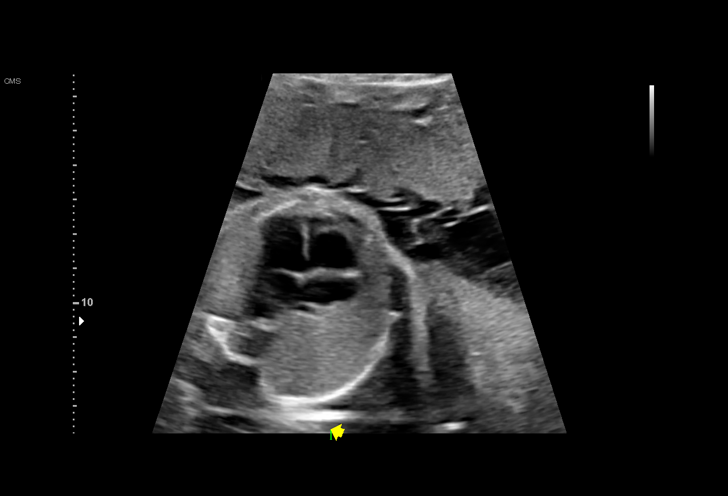
[im 39/53]
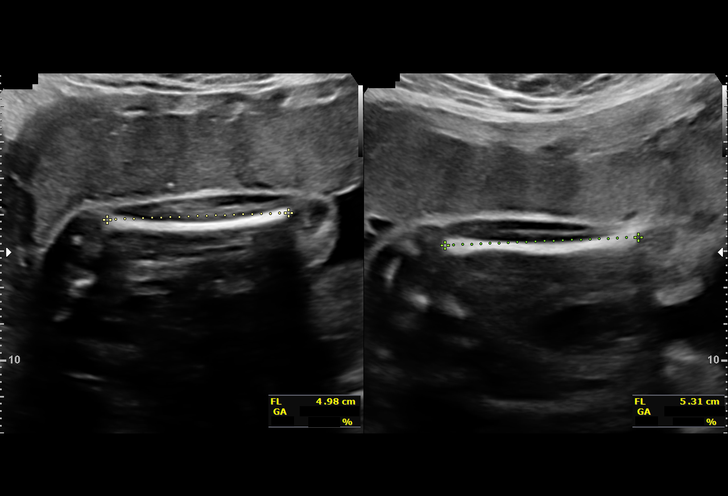
[im 43/53]
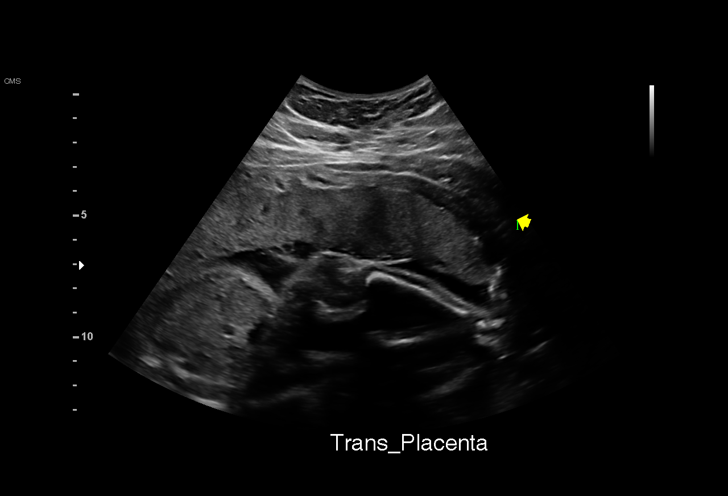
[im 47/53]
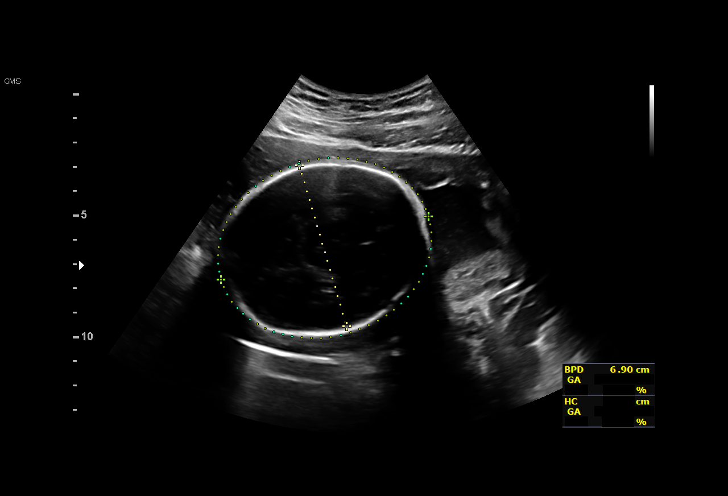
[im 51/53]
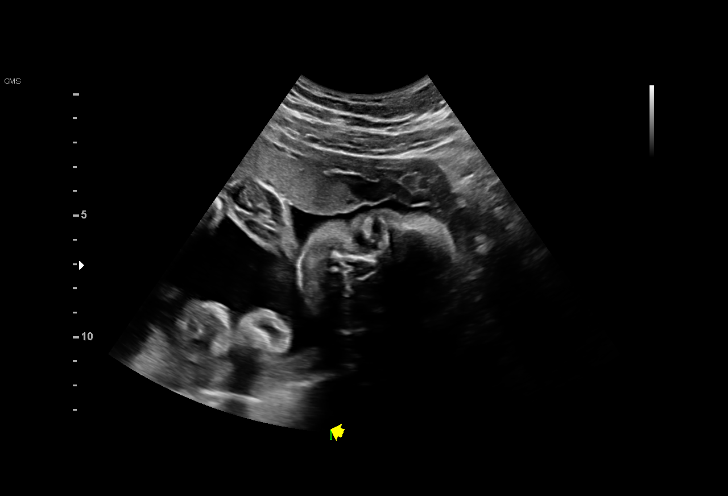

[13 of 28 positions shown; findings below may reference images not displayed]

[address]

 ----------------------------------------------------------------------

 ----------------------------------------------------------------------
Indications

  Medical complication of pregnancy
  (prolactinoma- followed by endocrinology)
  Encounter for other antenatal screening
  follow-up
  Subchorionic hemorrhage, antepartum
  Obesity complicating pregnancy, second
  trimester
  27 weeks gestation of pregnancy
 ----------------------------------------------------------------------
Fetal Evaluation

 Num Of Fetuses:          1
 Fetal Heart Rate(bpm):   158
 Cardiac Activity:        Observed
 Presentation:            Cephalic
 Placenta:                Anterior
 P. Cord Insertion:       Previously Visualized

 Amniotic Fluid
 AFI FV:      Within normal limits

                             Largest Pocket(cm)
                             5
Biometry

 BPD:      68.7  mm     G. Age:  27w 4d         55  %    CI:        73.05   %    70 - 86
                                                         FL/HC:       19.8  %    18.6 -
 HC:      255.5  mm     G. Age:  27w 5d         41  %    HC/AC:       1.12       1.05 -
 AC:       229   mm     G. Age:  27w 2d         46  %    FL/BPD:      73.8  %    71 - 87
 FL:       50.7  mm     G. Age:  27w 1d         36  %    FL/AC:       22.1  %    20 - 24
 HUM:      50.4  mm     G. Age:  29w 4d       > 95  %

 Est. FW:    5124   gm     2 lb 5 oz     44  %
OB History

 Gravidity:    2         Term:   1        Prem:   0        SAB:   0
 TOP:          0       Ectopic:  0        Living: 1
Gestational Age

 LMP:           28w 0d        Date:  05/07/19                 EDD:   02/11/20
 U/S Today:     27w 3d                                        EDD:   02/15/20
 Best:          27w 1d     Det. By:  Early Ultrasound         EDD:   02/17/20
                                     (06/22/19)
Anatomy

 Cranium:               Appears normal         Aortic Arch:            Previously seen
 Cavum:                 Appears normal         Ductal Arch:            Previously seen
 Ventricles:            Appears normal         Diaphragm:              Appears normal
 Choroid Plexus:        Appears normal         Stomach:                Appears normal, left
                                                                       sided
 Cerebellum:            Appears normal         Abdomen:                Appears normal
 Posterior Fossa:       Appears normal         Abdominal Wall:         Previously seen
 Nuchal Fold:           Previously seen        Cord Vessels:           Appears normal (3
                                                                       vessel cord)
 Face:                  Appears normal         Kidneys:                Appear normal
                        (orbits and profile)
 Lips:                  Appears normal         Bladder:                Appears normal
 Thoracic:              Appears normal         Spine:                  Previously seen
 Heart:                 Previously seen        Upper Extremities:      Previously seen
 RVOT:                  Appears normal         Lower Extremities:      Previously seen
 LVOT:                  Appears normal

 Other:  Heels and 5th digit previously visualized. Nasal bone visualized.
Cervix Uterus Adnexa

 Cervix
 Not visualized (advanced GA >96wks)

 Uterus
 No abnormality visualized.

 Left Ovary
 Not visualized.

 Right Ovary
 Within normal limits.

 Cul De Sac
 No free fluid seen.

 Adnexa
 No abnormality visualized.
Comments

 This patient was seen for a follow up growth scan due to a
 history of a prolactinoma. She denies any problems since her
 last exam.
 She was informed that the fetal growth and amniotic fluid
 level appears appropriate for her gestational age.
 A follow up exam was scheduled in 6 weeks.

## 2021-12-29 ENCOUNTER — Other Ambulatory Visit: Payer: Self-pay

## 2021-12-29 ENCOUNTER — Emergency Department (HOSPITAL_BASED_OUTPATIENT_CLINIC_OR_DEPARTMENT_OTHER)
Admission: EM | Admit: 2021-12-29 | Discharge: 2021-12-29 | Disposition: A | Discharge status: Home / Self Care | Payer: Medicaid Other | Attending: Emergency Medicine | Admitting: Emergency Medicine

## 2021-12-29 ENCOUNTER — Emergency Department (HOSPITAL_BASED_OUTPATIENT_CLINIC_OR_DEPARTMENT_OTHER): Payer: Medicaid Other | Admitting: Radiology

## 2021-12-29 ENCOUNTER — Encounter (HOSPITAL_BASED_OUTPATIENT_CLINIC_OR_DEPARTMENT_OTHER): Payer: Self-pay

## 2021-12-29 DIAGNOSIS — W19XXXA Unspecified fall, initial encounter: Secondary | ICD-10-CM | POA: Insufficient documentation

## 2021-12-29 DIAGNOSIS — M79642 Pain in left hand: Secondary | ICD-10-CM | POA: Insufficient documentation

## 2021-12-29 MED ORDER — IBUPROFEN 400 MG PO TABS
600.0000 mg | ORAL_TABLET | Freq: Once | ORAL | Status: AC
  Administered 2021-12-29: 600 mg via ORAL
  Filled 2021-12-29: qty 1

## 2021-12-29 NOTE — Discharge Instructions (Signed)
There is no fracture on x-ray of your hand.  Continue ibuprofen as needed for pain.  Soreness should resolve. ?

## 2021-12-29 NOTE — ED Provider Notes (Signed)
?[facility] ?Provider Note ? ? ?CSN: [id number] ?Arrival date & time: 12/29/21  7741 ? ?  ? ?History ? ?Chief Complaint  ?Patient presents with  ? Hand Pain  ? ? ?Patricia Morales is a 31 y.o. female. ? ?HPI ?Patient presents for pain in the area of her left fifth MCP.  Medical history includes anxiety, pituitary macroadenoma.  Patient noticed the pain yesterday.  She denies any recent known injury.  6 days ago, she did have a fall.  She is unsure if she landed on her hand.  She had ankle pain following this fall which has resolved.  She noticed the hand pain only recently.  Pain is worsened with palpation and range of motion of that fifth MCP.  She has not noticed any swelling or erythema.  She denies any other areas of discomfort at this time. ?  ? ?Home Medications ?Prior to Admission medications   ?Medication Sig Start Date End Date Taking? Authorizing Provider  ?cabergoline (DOSTINEX) 0.5 MG tablet Take 1 tablet (0.5 mg total) by mouth once a week. 09/30/21   Shamleffer, Melanie Crazier, MD  ?metroNIDAZOLE (FLAGYL) 500 MG tablet Take 1 tablet (500 mg total) by mouth 2 (two) times daily. 06/28/21   Hans Eden, NP  ?amLODipine (NORVASC) 5 MG tablet Take 1 tablet (5 mg total) by mouth daily. 03/20/20 04/13/20  Rasch, Artist Pais, NP  ?   ? ?Allergies    ?Patient has no known allergies.   ? ?Review of Systems   ?Review of Systems  ?Musculoskeletal:  Positive for arthralgias.  ?All other systems reviewed and are negative. ? ?Physical Exam ?Updated Vital Signs ?BP 116/90 (BP Location: Right Arm)   Pulse 78   Temp 98.3 ?F (36.8 ?C) (Oral)   Resp 16   SpO2 100%  ?Physical Exam ?Vitals and nursing note reviewed.  ?Constitutional:   ?   General: She is not in acute distress. ?   Appearance: Normal appearance. She is well-developed and normal weight. She is not ill-appearing, toxic-appearing or diaphoretic.  ?HENT:  ?   Head: Normocephalic and atraumatic.  ?   Right Ear: External ear  normal.  ?   Left Ear: External ear normal.  ?   Nose: Nose normal.  ?Eyes:  ?   Extraocular Movements: Extraocular movements intact.  ?   Conjunctiva/sclera: Conjunctivae normal.  ?Cardiovascular:  ?   Rate and Rhythm: Normal rate and regular rhythm.  ?   Heart sounds: No murmur heard. ?Pulmonary:  ?   Effort: Pulmonary effort is normal. No respiratory distress.  ?Abdominal:  ?   General: There is no distension.  ?   Palpations: Abdomen is soft.  ?Musculoskeletal:     ?   General: Tenderness present. No swelling or deformity.  ?   Cervical back: Normal range of motion and neck supple. No rigidity.  ?   Comments: Tenderness to left fifth MCP.  Range of motion of this joint is limited by pain.  There is no no swelling or erythema.  ?Skin: ?   General: Skin is warm and dry.  ?   Capillary Refill: Capillary refill takes less than 2 seconds.  ?   Coloration: Skin is not jaundiced or pale.  ?Neurological:  ?   General: No focal deficit present.  ?   Mental Status: She is alert and oriented to person, place, and time.  ?   Cranial Nerves: No cranial nerve deficit.  ?   Sensory: No sensory  deficit.  ?   Motor: No weakness.  ?   Coordination: Coordination normal.  ?Psychiatric:     ?   Mood and Affect: Mood normal.     ?   Behavior: Behavior normal.     ?   Thought Content: Thought content normal.     ?   Judgment: Judgment normal.  ? ? ?ED Results / Procedures / Treatments   ?Labs ?(all labs ordered are listed, but only abnormal results are displayed) ?Labs Reviewed - No data to display ? ?EKG ?None ? ?Radiology ?DG Hand Complete Left ? ?Result Date: 12/29/2021 ?CLINICAL DATA:  Fifth metacarpal pain.  No known injury. EXAM: LEFT HAND - COMPLETE 3+ VIEW COMPARISON:  None. FINDINGS: There is no evidence of fracture or dislocation. There is no evidence of arthropathy or other focal bone abnormality. Soft tissues are unremarkable. IMPRESSION: Negative. Electronically Signed   By: Kerby Moors M.D.   On: 12/29/2021 09:46    ? ?Procedures ?Procedures  ? ? ?Medications Ordered in ED ?Medications  ?ibuprofen (ADVIL) tablet 600 mg (600 mg Oral Given 12/29/21 0929)  ? ? ?ED Course/ Medical Decision Making/ A&P ?  ?                        ?Medical Decision Making ?Amount and/or Complexity of Data Reviewed ?Radiology: ordered. ? ? ?Patient presenting for pain in the area of her left MCP.  She does not have any recent known injuries but did suffer a fall 6 days ago during which she may have landed on her left hand.  She is well-appearing on exam.  Area of concern is nonswollen and nonerythematous.  Tenderness is present and patient's pain is also worsened with range of motion of her left fifth MCP.  X-ray imaging was obtained.  Ibuprofen was given for analgesia.  X-ray imaging showed no acute fractures.  Patient was advised to continue ibuprofen as needed for pain control.  She was discharged in good condition. ? ? ? ? ? ? ? ?Final Clinical Impression(s) / ED Diagnoses ?Final diagnoses:  ?Left hand pain  ? ? ?Rx / DC Orders ?ED Discharge Orders   ? ? None  ? ?  ? ? ?  ?Godfrey Pick, MD ?12/30/21 [phone] ? ?

## 2021-12-29 NOTE — ED Triage Notes (Signed)
Patient c.o left pinky pain at knuckle joint. Denies known injury  ?

## 2022-02-01 ENCOUNTER — Encounter: Payer: Self-pay | Admitting: *Deleted

## 2022-02-08 ENCOUNTER — Other Ambulatory Visit: Payer: Self-pay

## 2022-02-08 ENCOUNTER — Emergency Department (HOSPITAL_BASED_OUTPATIENT_CLINIC_OR_DEPARTMENT_OTHER)
Admission: EM | Admit: 2022-02-08 | Discharge: 2022-02-08 | Disposition: A | Discharge status: Home / Self Care | Payer: Medicaid Other | Attending: Emergency Medicine | Admitting: Emergency Medicine

## 2022-02-08 ENCOUNTER — Encounter (HOSPITAL_BASED_OUTPATIENT_CLINIC_OR_DEPARTMENT_OTHER): Payer: Self-pay | Admitting: Emergency Medicine

## 2022-02-08 ENCOUNTER — Other Ambulatory Visit (HOSPITAL_BASED_OUTPATIENT_CLINIC_OR_DEPARTMENT_OTHER): Payer: Self-pay

## 2022-02-08 DIAGNOSIS — Z202 Contact with and (suspected) exposure to infections with a predominantly sexual mode of transmission: Secondary | ICD-10-CM | POA: Diagnosis not present

## 2022-02-08 DIAGNOSIS — A599 Trichomoniasis, unspecified: Secondary | ICD-10-CM | POA: Insufficient documentation

## 2022-02-08 DIAGNOSIS — N898 Other specified noninflammatory disorders of vagina: Secondary | ICD-10-CM | POA: Diagnosis present

## 2022-02-08 LAB — WET PREP, GENITAL
Clue Cells Wet Prep HPF POC: NONE SEEN
Sperm: NONE SEEN
WBC, Wet Prep HPF POC: <10 (ref <10)
Yeast Wet Prep HPF POC: NONE SEEN

## 2022-02-08 MED ORDER — METRONIDAZOLE 500 MG PO TABS
500.0000 mg | ORAL_TABLET | Freq: Two times a day (BID) | ORAL | 0 refills | Status: AC
  Filled 2022-02-08: qty 14, 7d supply, fill #0

## 2022-02-08 NOTE — ED Provider Notes (Signed)
?[facility] ?Provider Note ? ? ?CSN: [id number] ?Arrival date & time: 02/08/22  0945 ? ?  ? ?History ? ?Chief Complaint  ?Patient presents with  ? Vaginal Discharge  ? ? ?Patricia Morales is a 31 y.o. female. ? ?Patient with no pertinent past medical history presents today with complaints of vaginal discharge. She states that same has been ongoing for the past few days with copious amounts of foul smelling drainage. She endorses recent sexual intercourse. Denies any abdominal pain, pelvic pain, fevers, or chills. ? ?The history is provided by the patient. No language interpreter was used.  ?Vaginal Discharge ?Associated symptoms: no fever   ? ?  ? ?Home Medications ?Prior to Admission medications   ?Medication Sig Start Date End Date Taking? Authorizing Provider  ?cabergoline (DOSTINEX) 0.5 MG tablet Take 1 tablet (0.5 mg total) by mouth once a week. 09/30/21   Shamleffer, Melanie Crazier, MD  ?metroNIDAZOLE (FLAGYL) 500 MG tablet Take 1 tablet (500 mg total) by mouth 2 (two) times daily. 06/28/21   Hans Eden, NP  ?amLODipine (NORVASC) 5 MG tablet Take 1 tablet (5 mg total) by mouth daily. 03/20/20 04/13/20  Rasch, Artist Pais, NP  ?   ? ?Allergies    ?Patient has no known allergies.   ? ?Review of Systems   ?Review of Systems  ?Constitutional:  Negative for chills and fever.  ?Genitourinary:  Positive for vaginal discharge.  ?All other systems reviewed and are negative. ? ?Physical Exam ?Updated Vital Signs ?BP 109/77 (BP Location: Right Arm)   Pulse 87   Temp 98.4 ?F (36.9 ?C) (Oral)   Resp 16   Ht '5\' 4"'$  (1.626 m)   Wt 80.7 kg   LMP 01/17/2022 (Approximate)   SpO2 98%   BMI 30.55 kg/m?  ?Physical Exam ?Vitals and nursing note reviewed. Exam conducted with a chaperone present.  ?Constitutional:   ?   General: She is not in acute distress. ?   Appearance: Normal appearance. She is normal weight. She is not ill-appearing, toxic-appearing or diaphoretic.  ?   Comments: Patient  resting comfortably in bed in no acute distress  ?HENT:  ?   Head: Normocephalic and atraumatic.  ?Cardiovascular:  ?   Rate and Rhythm: Normal rate.  ?Pulmonary:  ?   Effort: Pulmonary effort is normal. No respiratory distress.  ?Genitourinary: ?   Vagina: Vaginal discharge present.  ?   Comments: Large amount purulent discharge located in the vagina. No wounds or other abnormality visualized. No cervical abnormality. No CMT or adnexal tenderness noted ?Musculoskeletal:     ?   General: Normal range of motion.  ?   Cervical back: Normal range of motion.  ?Skin: ?   General: Skin is warm and dry.  ?Neurological:  ?   General: No focal deficit present.  ?   Mental Status: She is alert.  ?Psychiatric:     ?   Mood and Affect: Mood normal.     ?   Behavior: Behavior normal.  ? ? ?ED Results / Procedures / Treatments   ?Labs ?(all labs ordered are listed, but only abnormal results are displayed) ?Labs Reviewed  ?WET PREP, GENITAL - Abnormal; Notable for the following components:  ?    Result Value  ? Trich, Wet Prep PRESENT (*)   ? All other components within normal limits  ?HIV ANTIBODY (ROUTINE TESTING W REFLEX)  ?RPR  ?GC/CHLAMYDIA PROBE AMP () NOT AT [facility]  ? ? ?EKG ?None ? ?  Radiology ?No results found. ? ?Procedures ?Procedures  ? ? ?Medications Ordered in ED ?Medications - No data to display ? ?ED Course/ Medical Decision Making/ A&P ?  ?                        ?Medical Decision Making ?Amount and/or Complexity of Data Reviewed ?Labs: ordered. ? ? ?Pt presents with vaginal discharge and concerns for possible STD.  Wet prep positive for trichomonas, will treat for same with Flagyl.   Pt has been advised to not drink alcohol while on this medication. Pt understands that they have GC/Chlamydia cultures pending and that they will need to inform all sexual partners if results return positive.  Offered GC/chlamydia prophylaxis which she declined.  She has been informed that she will need to return for  treatment if she has a positive result.  Pt not concerning for PID because hemodynamically stable and no cervical motion tenderness on pelvic exam. Patient to be discharged with instructions to follow up with OBGYN/PCP. Discussed importance of using protection when sexually active.  Patient understanding and amenable with plan, educated on red flag symptoms of prompt return.  Discharged in stable condition. ? ? ?Final Clinical Impression(s) / ED Diagnoses ?Final diagnoses:  ?Exposure to STD  ?Trichomonas infection  ? ? ?Rx / DC Orders ?ED Discharge Orders   ? ?      Ordered  ?  metroNIDAZOLE (FLAGYL) 500 MG tablet  2 times daily       ? 02/08/22 1113  ? ?  ?  ? ?  ?An After Visit Summary was printed and given to the patient. ? ? ?  ?Bud Face, PA-C ?02/08/22 1114 ? ?  ?Wyvonnia Dusky, MD ?02/08/22 1352 ? ?

## 2022-02-08 NOTE — ED Triage Notes (Signed)
Pt reports vaginal discharge and cramps for the past week.  ?

## 2022-02-08 NOTE — Discharge Instructions (Signed)
As we discussed, testing in the emergency department revealed that you are positive for trichomonas.  I have given you a prescription for antibiotics for management of this.  Please fill this prescription and take entire course as prescribed for management.  Please do not have sexual intercourse until you have completed treatment and are no longer symptomatic.  You also need to inform all sexual partners of your positive test. ? ?Additionally, please be aware that you should not drink alcohol while taking this medication. ? ?Additionally, your testing for syphilis, HIV, gonorrhea, and chlamydia are all pending and should result in the next 24 hours to your MyChart.  Please monitor this for and return for treatment if anything returns positive. ? ?Return if development of any new or worsening symptoms. ?

## 2022-02-09 LAB — GC/CHLAMYDIA PROBE AMP (~~LOC~~) NOT AT ARMC
Chlamydia: NEGATIVE
Comment: NEGATIVE
Comment: NORMAL
Neisseria Gonorrhea: NEGATIVE

## 2022-02-09 LAB — HIV ANTIBODY (ROUTINE TESTING W REFLEX): HIV Screen 4th Generation wRfx: NONREACTIVE

## 2022-02-09 LAB — RPR: RPR Ser Ql: NONREACTIVE

## 2022-02-20 ENCOUNTER — Other Ambulatory Visit: Payer: Self-pay

## 2022-02-20 ENCOUNTER — Emergency Department (HOSPITAL_BASED_OUTPATIENT_CLINIC_OR_DEPARTMENT_OTHER)
Admission: EM | Admit: 2022-02-20 | Discharge: 2022-02-20 | Disposition: A | Discharge status: Home / Self Care | Payer: Medicaid Other | Attending: Emergency Medicine | Admitting: Emergency Medicine

## 2022-02-20 DIAGNOSIS — R300 Dysuria: Secondary | ICD-10-CM | POA: Diagnosis not present

## 2022-02-20 DIAGNOSIS — K6289 Other specified diseases of anus and rectum: Secondary | ICD-10-CM | POA: Diagnosis not present

## 2022-02-20 LAB — URINALYSIS, ROUTINE W REFLEX MICROSCOPIC
Bilirubin Urine: NEGATIVE
Glucose, UA: NEGATIVE mg/dL
Ketones, ur: NEGATIVE mg/dL
Leukocytes,Ua: NEGATIVE
Nitrite: NEGATIVE
Protein, ur: NEGATIVE mg/dL
RBC / HPF: >50 RBC/hpf — ABNORMAL HIGH (ref 0–5)
Specific Gravity, Urine: 1.014 (ref 1.005–1.030)
pH: 6.5 (ref 5.0–8.0)

## 2022-02-20 LAB — WET PREP, GENITAL
Clue Cells Wet Prep HPF POC: NONE SEEN
Sperm: NONE SEEN
Trich, Wet Prep: NONE SEEN
WBC, Wet Prep HPF POC: <10 (ref <10)
Yeast Wet Prep HPF POC: NONE SEEN

## 2022-02-20 MED ORDER — DOXYCYCLINE HYCLATE 100 MG PO CAPS: 100.0000 mg | ORAL_CAPSULE | Freq: Two times a day (BID) | ORAL | 0 refills | Status: AC

## 2022-02-20 MED ORDER — CEFTRIAXONE SODIUM 500 MG IJ SOLR
500.0000 mg | Freq: Once | INTRAMUSCULAR | Status: AC
  Administered 2022-02-20: 500 mg via INTRAMUSCULAR
  Filled 2022-02-20: qty 500

## 2022-02-20 MED ORDER — LIDOCAINE HCL (PF) 1 % IJ SOLN
INTRAMUSCULAR | Status: AC
  Administered 2022-02-20: 1 mL
  Filled 2022-02-20: qty 5

## 2022-02-20 NOTE — ED Provider Notes (Signed)
?[facility] ?Provider Note ? ? ?CSN: [id number] ?Arrival date & time: 02/20/22  1146 ? ?  ? ?History ? ?Chief Complaint  ?Patient presents with  ? Rectal Pain  ? ? ?Patricia Morales is a 31 y.o. female with no pertinent medical history.  The patient presents ED for evaluation of rectal pain.  Patient states that last week she had anal sex for the first time with a partner that is known to her.  The patient reports that since this time, she has had increased rectal pain and burning with urination.  Patient states that she recently took antibiotics for trichomonas, took her last medication last Friday.  Patient continues that she also recently started her period on 5/2.  Patient states that she got the trichomonas from her child's father, who is a different person than who she had anal sex with.  The patient continues that since this time, she has had daily bowel movements that are slightly painful.  The patient has been taking stool softener to mediate some of the pain.  Patient denies any fevers, pelvic pain ? ?HPI ? ?  ? ?Home Medications ?Prior to Admission medications   ?Medication Sig Start Date End Date Taking? Authorizing Provider  ?doxycycline (VIBRAMYCIN) 100 MG capsule Take 1 capsule (100 mg total) by mouth 2 (two) times daily. 02/20/22  Yes Azucena Cecil, PA-C  ?cabergoline (DOSTINEX) 0.5 MG tablet Take 1 tablet (0.5 mg total) by mouth once a week. 09/30/21   Shamleffer, Melanie Crazier, MD  ?metroNIDAZOLE (FLAGYL) 500 MG tablet Take 1 tablet (500 mg total) by mouth 2 (two) times daily. 02/08/22   Smoot, Sarah A, PA-C  ?amLODipine (NORVASC) 5 MG tablet Take 1 tablet (5 mg total) by mouth daily. 03/20/20 04/13/20  Rasch, Artist Pais, NP  ?   ? ?Allergies    ?Patient has no known allergies.   ? ?Review of Systems   ?Review of Systems  ?Constitutional:  Negative for chills and fever.  ?Gastrointestinal:  Positive for rectal pain. Negative for nausea and vomiting.  ?Genitourinary:   Positive for dysuria and vaginal bleeding. Negative for flank pain, hematuria and pelvic pain.  ?All other systems reviewed and are negative. ? ?Physical Exam ?Updated Vital Signs ?BP 104/75 (BP Location: Right Arm)   Pulse 70   Temp 98.8 ?F (37.1 ?C)   Resp 18   Ht '5\' 4"'$  (1.626 m)   Wt 78.5 kg   LMP 02/16/2022 (Exact Date)   SpO2 100%   BMI 29.70 kg/m?  ?Physical Exam ?Vitals and nursing note reviewed.  ?Constitutional:   ?   General: She is not in acute distress. ?   Appearance: Normal appearance. She is not ill-appearing, toxic-appearing or diaphoretic.  ?HENT:  ?   Head: Normocephalic and atraumatic.  ?   Nose: Nose normal. No congestion.  ?   Mouth/Throat:  ?   Mouth: Mucous membranes are moist.  ?   Pharynx: Oropharynx is clear.  ?Eyes:  ?   Extraocular Movements: Extraocular movements intact.  ?   Conjunctiva/sclera: Conjunctivae normal.  ?   Pupils: Pupils are equal, round, and reactive to light.  ?Cardiovascular:  ?   Rate and Rhythm: Normal rate and regular rhythm.  ?Pulmonary:  ?   Effort: Pulmonary effort is normal.  ?   Breath sounds: Normal breath sounds. No wheezing.  ?Abdominal:  ?   General: Abdomen is flat. Bowel sounds are normal.  ?   Palpations: Abdomen is soft.  ?  Tenderness: There is no abdominal tenderness.  ?Genitourinary: ?   Rectum: Normal.  ?   Comments: No obvious tear noted the patient rectum.  No obvious hemorrhaging noted. ?Musculoskeletal:  ?   Cervical back: Normal range of motion and neck supple. No tenderness.  ?Skin: ?   General: Skin is warm and dry.  ?   Capillary Refill: Capillary refill takes less than 2 seconds.  ?Neurological:  ?   Mental Status: She is alert and oriented to person, place, and time.  ? ? ?ED Results / Procedures / Treatments   ?Labs ?(all labs ordered are listed, but only abnormal results are displayed) ?Labs Reviewed  ?URINALYSIS, ROUTINE W REFLEX MICROSCOPIC - Abnormal; Notable for the following components:  ?    Result Value  ? Hgb urine  dipstick LARGE (*)   ? RBC / HPF >50 (*)   ? All other components within normal limits  ?WET PREP, GENITAL  ?GC/CHLAMYDIA PROBE AMP ([place]) NOT AT [facility]  ?GC/CHLAMYDIA PROBE AMP () NOT AT [facility]  ? ? ?EKG ?None ? ?Radiology ?No results found. ? ?Procedures ?Procedures  ? ? ?Medications Ordered in ED ?Medications  ?cefTRIAXone (ROCEPHIN) injection 500 mg (500 mg Intramuscular Given 02/20/22 1531)  ?lidocaine (PF) (XYLOCAINE) 1 % injection (1 mL  Given 02/20/22 1531)  ? ? ?ED Course/ Medical Decision Making/ A&P ?  ?                        ?Medical Decision Making ?Amount and/or Complexity of Data Reviewed ?Labs: ordered. ? ? ?31 year old female presents ED for evaluation of rectal pain.  Please see HPI for further details. ? ?On examination, patient is afebrile, nontachycardic, nonhypoxic.  Patient lung sounds are clear bilaterally.  Patient abdomen soft compressible all 4 quadrants.  Patient rectum examined, shows no signs of tear, no signs of hemorrhoid. ? ?Patient worked up utilizing the following labs and imaging studies interpreted by me personally: ?- Wet prep negative ?- Urinalysis shows hemoglobin, greater than 50 red blood cells per high-power field however the patient is currently on her menstrual period ?- Gonorrhea chlamydia probe, vaginal source pending ?- Gonorrhea chlamydia probe, anal source is pending ? ?Patient treated with 500 mg ceftriaxone out of an abundance of caution for suspected prostatitis.  Patient will be placed on 7 days of doxycycline twice daily. ? ?Patient advised to follow-up on the results of her gonorrhea chlamydia screening today.  Patient given return precautions and she voiced understanding.  The patient was advised to follow-up with her PCP for any ongoing needs.  Patient voiced understanding of my instructions.  The patient is agreeable to plan.  The patient is stable for discharge home ? ? ?Final Clinical Impression(s) / ED Diagnoses ?Final diagnoses:  ?Rectal pain   ? ? ?Rx / DC Orders ?ED Discharge Orders   ? ?      Ordered  ?  doxycycline (VIBRAMYCIN) 100 MG capsule  2 times daily       ? 02/20/22 1535  ? ?  ?  ? ?  ? ? ?  ?Azucena Cecil, PA-C ?02/20/22 1540 ? ?  ?Margette Fast, MD ?02/23/22 1006 ? ?

## 2022-02-20 NOTE — Discharge Instructions (Addendum)
Return to the ED with any new symptoms such as fevers, pelvic pain, nausea or vomiting ?Please follow-up with your PCP for any ongoing needs concerning rectal pain ?Please pick up antibiotics I prescribed you and take them for the next 7 days ?Please read the attached informational guide concerning prostatitis. ?If your gonorrhea/chlamydia screening comes back negative, you can stop taking antibiotics ?

## 2022-02-20 NOTE — ED Notes (Signed)
Pt initially refused abx, but after reinforcing the EDPs reasoning pt was agreeable to tx.  ?

## 2022-02-20 NOTE — ED Triage Notes (Signed)
Pt from home c/o left buttocks pressure radiating to vaginal area x 1 week s/p anal sex. Pt states "first time doing it and thought pain will go away". Pt reports normal BM today.  ?

## 2022-02-20 NOTE — ED Notes (Signed)
Pt provided discharge instructions and prescription information. Pt was given the opportunity to ask questions and questions were answered.   

## 2022-02-22 LAB — GC/CHLAMYDIA PROBE AMP (~~LOC~~) NOT AT ARMC
Chlamydia: NEGATIVE
Comment: NEGATIVE
Comment: NORMAL
Neisseria Gonorrhea: NEGATIVE

## 2022-02-28 ENCOUNTER — Encounter (HOSPITAL_BASED_OUTPATIENT_CLINIC_OR_DEPARTMENT_OTHER): Payer: Self-pay | Admitting: Obstetrics and Gynecology

## 2022-02-28 ENCOUNTER — Emergency Department (HOSPITAL_BASED_OUTPATIENT_CLINIC_OR_DEPARTMENT_OTHER): Payer: Medicaid Other

## 2022-02-28 ENCOUNTER — Other Ambulatory Visit: Payer: Self-pay

## 2022-02-28 ENCOUNTER — Emergency Department (HOSPITAL_BASED_OUTPATIENT_CLINIC_OR_DEPARTMENT_OTHER)
Admission: EM | Admit: 2022-02-28 | Discharge: 2022-02-28 | Disposition: A | Discharge status: Home / Self Care | Payer: Medicaid Other | Attending: Emergency Medicine | Admitting: Emergency Medicine

## 2022-02-28 DIAGNOSIS — R102 Pelvic and perineal pain: Secondary | ICD-10-CM | POA: Diagnosis present

## 2022-02-28 DIAGNOSIS — K4090 Unilateral inguinal hernia, without obstruction or gangrene, not specified as recurrent: Secondary | ICD-10-CM | POA: Insufficient documentation

## 2022-02-28 DIAGNOSIS — Q430 Meckel's diverticulum (displaced) (hypertrophic): Secondary | ICD-10-CM | POA: Insufficient documentation

## 2022-02-28 DIAGNOSIS — K5792 Diverticulitis of intestine, part unspecified, without perforation or abscess without bleeding: Secondary | ICD-10-CM

## 2022-02-28 LAB — URINALYSIS, ROUTINE W REFLEX MICROSCOPIC
Bilirubin Urine: NEGATIVE
Glucose, UA: NEGATIVE mg/dL
Ketones, ur: 15 mg/dL — AB
Leukocytes,Ua: NEGATIVE
Nitrite: NEGATIVE
Protein, ur: 30 mg/dL — AB
Specific Gravity, Urine: 1.036 — ABNORMAL HIGH (ref 1.005–1.030)
pH: 6 (ref 5.0–8.0)

## 2022-02-28 LAB — CBC WITH DIFFERENTIAL/PLATELET
Abs Immature Granulocytes: 0.01 10*3/uL (ref 0.00–0.07)
Basophils Absolute: 0.1 10*3/uL (ref 0.0–0.1)
Basophils Relative: 1 %
Eosinophils Absolute: 0.3 10*3/uL (ref 0.0–0.5)
Eosinophils Relative: 6 %
HCT: 34.1 % — ABNORMAL LOW (ref 36.0–46.0)
Hemoglobin: 11.6 g/dL — ABNORMAL LOW (ref 12.0–15.0)
Immature Granulocytes: 0 %
Lymphocytes Relative: 54 %
Lymphs Abs: 3.1 10*3/uL (ref 0.7–4.0)
MCH: 29.8 pg (ref 26.0–34.0)
MCHC: 34 g/dL (ref 30.0–36.0)
MCV: 87.7 fL (ref 80.0–100.0)
Monocytes Absolute: 0.4 10*3/uL (ref 0.1–1.0)
Monocytes Relative: 7 %
Neutro Abs: 1.8 10*3/uL (ref 1.7–7.7)
Neutrophils Relative %: 32 %
Platelets: 260 10*3/uL (ref 150–400)
RBC: 3.89 MIL/uL (ref 3.87–5.11)
RDW: 13 % (ref 11.5–15.5)
WBC: 5.7 10*3/uL (ref 4.0–10.5)
nRBC: 0 % (ref 0.0–0.2)

## 2022-02-28 LAB — COMPREHENSIVE METABOLIC PANEL
ALT: 10 U/L (ref 0–44)
AST: 14 U/L — ABNORMAL LOW (ref 15–41)
Albumin: 4.3 g/dL (ref 3.5–5.0)
Alkaline Phosphatase: 41 U/L (ref 38–126)
Anion gap: 8 (ref 5–15)
BUN: 17 mg/dL (ref 6–20)
CO2: 27 mmol/L (ref 22–32)
Calcium: 9.7 mg/dL (ref 8.9–10.3)
Chloride: 101 mmol/L (ref 98–111)
Creatinine, Ser: 0.62 mg/dL (ref 0.44–1.00)
GFR, Estimated: >60 mL/min (ref >60)
Glucose, Bld: 81 mg/dL (ref 70–99)
Potassium: 3.2 mmol/L — ABNORMAL LOW (ref 3.5–5.1)
Sodium: 136 mmol/L (ref 135–145)
Total Bilirubin: 0.6 mg/dL (ref 0.3–1.2)
Total Protein: 7.9 g/dL (ref 6.5–8.1)

## 2022-02-28 LAB — PREGNANCY, URINE: Preg Test, Ur: NEGATIVE

## 2022-02-28 LAB — LIPASE, BLOOD: Lipase: 27 U/L (ref 11–51)

## 2022-02-28 MED ORDER — HYDROMORPHONE HCL 1 MG/ML IJ SOLN
1.0000 mg | Freq: Once | INTRAMUSCULAR | Status: DC
  Filled 2022-02-28: qty 1

## 2022-02-28 MED ORDER — OXYCODONE-ACETAMINOPHEN 5-325 MG PO TABS: 1.0000 | ORAL_TABLET | Freq: Four times a day (QID) | ORAL | 0 refills | Status: AC | PRN

## 2022-02-28 MED ORDER — AMOXICILLIN-POT CLAVULANATE 875-125 MG PO TABS: 1.0000 | ORAL_TABLET | Freq: Three times a day (TID) | ORAL | 0 refills | Status: AC

## 2022-02-28 MED ORDER — MORPHINE SULFATE (PF) 4 MG/ML IV SOLN
4.0000 mg | Freq: Once | INTRAVENOUS | Status: AC
  Administered 2022-02-28: 4 mg via INTRAVENOUS
  Filled 2022-02-28: qty 1

## 2022-02-28 MED ORDER — IOHEXOL 300 MG/ML  SOLN
100.0000 mL | Freq: Once | INTRAMUSCULAR | Status: AC | PRN
  Administered 2022-02-28: 100 mL via INTRAVENOUS

## 2022-02-28 NOTE — ED Notes (Signed)
Pt in ultrasound

## 2022-02-28 NOTE — ED Notes (Signed)
Patient transported to CT 

## 2022-02-28 NOTE — ED Provider Notes (Signed)
?[facility] ?Provider Note ? ? ?CSN: [id number] ?Arrival date & time: 02/28/22  1554 ? ?  ? ?History ?PMH: recent trichomonas infection ?Chief Complaint  ?Patient presents with  ? Pelvic Pain  ? ? ?Patricia Morales is a 31 y.o. female. ?Presents the ED with a chief complaint of pelvic pain.  She says over the past week she has had worsening lower abdominal pain.  She said it for started in her right lower quadrant but is now spread across her entire pelvic region.  She says it feels like pressure.  She rates it 10 out of 10 pain.  She has never had the symptoms before.  She says it is constant.  Walking and sneezing makes it worse.  She has not tried anything to make it better.  She does state that she feels a lot of pressure when she is trying to urinate and feels like she has difficulty starting her stream. She denies dysuria, hematuria, or flank pain. ?2 weeks ago, she was diagnosed with trichomonas and was treated with Flagyl.  She did complete this course.  She says she stopped having sex with that partner but is now having sex with a different partner.  She says that her vaginal discharge is cleared up.  She has not had any fevers or chills.  She was tested again about 8 days ago and was negative.  Does not think that she could be pregnant.  Denies any history of abdominal surgeries.  She denies any nausea, vomiting, diarrhea, or constipation.   ? ? ?Pelvic Pain ?Associated symptoms include abdominal pain.  ? ?  ? ?Home Medications ?Prior to Admission medications   ?Medication Sig Start Date End Date Taking? Authorizing Provider  ?amoxicillin-clavulanate (AUGMENTIN) 875-125 MG tablet Take 1 tablet by mouth every 8 (eight) hours for 5 days. 02/28/22 03/05/22 Yes Jersie Beel, Adora Fridge, PA-C  ?oxyCODONE-acetaminophen (PERCOCET/ROXICET) 5-325 MG tablet Take 1 tablet by mouth every 6 (six) hours as needed for up to 5 days for severe pain. 02/28/22 03/05/22 Yes Claude Waldman, Adora Fridge, PA-C   ?cabergoline (DOSTINEX) 0.5 MG tablet Take 1 tablet (0.5 mg total) by mouth once a week. 09/30/21   Shamleffer, Melanie Crazier, MD  ?doxycycline (VIBRAMYCIN) 100 MG capsule Take 1 capsule (100 mg total) by mouth 2 (two) times daily. 02/20/22   Azucena Cecil, PA-C  ?metroNIDAZOLE (FLAGYL) 500 MG tablet Take 1 tablet (500 mg total) by mouth 2 (two) times daily. 02/08/22   Smoot, Sarah A, PA-C  ?amLODipine (NORVASC) 5 MG tablet Take 1 tablet (5 mg total) by mouth daily. 03/20/20 04/13/20  Rasch, Artist Pais, NP  ?   ? ?Allergies    ?Patient has no known allergies.   ? ?Review of Systems   ?Review of Systems  ?Constitutional:  Negative for chills and fever.  ?Gastrointestinal:  Positive for abdominal pain. Negative for abdominal distention, blood in stool, constipation, diarrhea, nausea and vomiting.  ?Genitourinary:  Positive for difficulty urinating and pelvic pain. Negative for dysuria, flank pain and hematuria.  ?All other systems reviewed and are negative. ? ?Physical Exam ?Updated Vital Signs ?BP 112/76   Pulse 69   Temp 99.1 ?F (37.3 ?C) (Oral)   Resp 20   Ht '5\' 4"'$  (1.626 m)   Wt 78.5 kg   LMP 02/16/2022 (Exact Date)   SpO2 98%   BMI 29.70 kg/m?  ?Physical Exam ?Vitals and nursing note reviewed.  ?Constitutional:   ?   General: She is not in acute  distress. ?   Appearance: Normal appearance. She is not ill-appearing, toxic-appearing or diaphoretic.  ?HENT:  ?   Head: Normocephalic and atraumatic.  ?   Nose: No nasal deformity.  ?   Mouth/Throat:  ?   Lips: Pink. No lesions.  ?   Mouth: No injury, lacerations, oral lesions or angioedema.  ?   Pharynx: Uvula midline. No pharyngeal swelling or uvula swelling.  ?Eyes:  ?   General: Gaze aligned appropriately. No scleral icterus.    ?   Right eye: No discharge.     ?   Left eye: No discharge.  ?   Conjunctiva/sclera: Conjunctivae normal.  ?   Right eye: Right conjunctiva is not injected. No exudate or hemorrhage. ?   Left eye: Left conjunctiva is not  injected. No exudate or hemorrhage. ?Cardiovascular:  ?   Rate and Rhythm: Normal rate and regular rhythm.  ?   Pulses: Normal pulses.     ?     Radial pulses are 2+ on the right side and 2+ on the left side.  ?     Dorsalis pedis pulses are 2+ on the right side and 2+ on the left side.  ?   Heart sounds: Normal heart sounds, S1 normal and S2 normal. Heart sounds not distant. No murmur heard. ?  No friction rub. No gallop. No S3 or S4 sounds.  ?Pulmonary:  ?   Effort: Pulmonary effort is normal. No accessory muscle usage or respiratory distress.  ?   Breath sounds: Normal breath sounds. No stridor. No wheezing, rhonchi or rales.  ?Chest:  ?   Chest wall: No tenderness.  ?Abdominal:  ?   General: Abdomen is flat. There is no distension.  ?   Palpations: Abdomen is soft. There is no mass or pulsatile mass.  ?   Tenderness: There is abdominal tenderness. There is no right CVA tenderness, left CVA tenderness, guarding or rebound.  ?   Comments: She has moderate right lower quadrant tenderness that is more pronounced with some voluntary guarding present.  She also has some mild tenderness in the suprapubic and left lower quadrant.  ?Musculoskeletal:  ?   Right lower leg: No edema.  ?   Left lower leg: No edema.  ?Skin: ?   General: Skin is warm and dry.  ?   Coloration: Skin is not jaundiced or pale.  ?   Findings: No bruising, erythema, lesion or rash.  ?Neurological:  ?   General: No focal deficit present.  ?   Mental Status: She is alert and oriented to person, place, and time.  ?   GCS: GCS eye subscore is 4. GCS verbal subscore is 5. GCS motor subscore is 6.  ?Psychiatric:     ?   Mood and Affect: Mood normal.     ?   Behavior: Behavior normal. Behavior is cooperative.  ? ? ?ED Results / Procedures / Treatments   ?Labs ?(all labs ordered are listed, but only abnormal results are displayed) ?Labs Reviewed  ?CBC WITH DIFFERENTIAL/PLATELET - Abnormal; Notable for the following components:  ?    Result Value  ?  Hemoglobin 11.6 (*)   ? HCT 34.1 (*)   ? All other components within normal limits  ?COMPREHENSIVE METABOLIC PANEL - Abnormal; Notable for the following components:  ? Potassium 3.2 (*)   ? AST 14 (*)   ? All other components within normal limits  ?URINALYSIS, ROUTINE W REFLEX MICROSCOPIC - Abnormal; Notable for the  following components:  ? Specific Gravity, Urine 1.036 (*)   ? Hgb urine dipstick MODERATE (*)   ? Ketones, ur 15 (*)   ? Protein, ur 30 (*)   ? All other components within normal limits  ?LIPASE, BLOOD  ?PREGNANCY, URINE  ? ? ?EKG ?None ? ?Radiology ?CT Abdomen Pelvis W Contrast ? ?Result Date: 02/28/2022 ?CLINICAL DATA:  Lower abdominopelvic pain EXAM: CT ABDOMEN AND PELVIS WITH CONTRAST TECHNIQUE: Multidetector CT imaging of the abdomen and pelvis was performed using the standard protocol following bolus administration of intravenous contrast. RADIATION DOSE REDUCTION: This exam was performed according to the departmental dose-optimization program which includes automated exposure control, adjustment of the mA and/or kV according to patient size and/or use of iterative reconstruction technique. CONTRAST:  157m OMNIPAQUE IOHEXOL 300 MG/ML  SOLN COMPARISON:  CT abdomen/pelvis dated 08/27/2021 FINDINGS: Lower chest: Lung bases are clear. Hepatobiliary: Liver is within normal limits. Gallbladder is unremarkable. No intrahepatic or extrahepatic ductal dilatation. Pancreas: Within normal limits. Spleen: Within normal limits. Adrenals/Urinary Tract: Adrenal glands are within normal limits. Kidneys are within normal limits.  No hydronephrosis. Bladder is within normal limits. Stomach/Bowel: Stomach is within normal limits. No evidence of bowel obstruction. Appendix is not discretely visualized. No findings to suggest acute appendicitis. Mildly dilated tubular structure in the right lower quadrant (series 2/image 66) which is favored to be arising from the ileum (coronal image 59), extending into a small right  inguinal hernia (series 2/images 72 and 74), with mild surrounding inflammation along the right upper abdomen (series 2/image 38). Overall appearance is progressive from prior studies. While indeterminate, this appearance raises conc

## 2022-02-28 NOTE — ED Notes (Addendum)
Pt aware of urine sample needed, ua cup at bedside. States unable to void at this time. ?

## 2022-02-28 NOTE — Discharge Instructions (Signed)
You were diagnosed with diverticulitis today.  He also has an associated hernia that you will need to get fixed by general surgery.  Please call Dr. Grandville Silos to schedule an appointment as soon as possible. ? ?I have also provided you follow-up with [facility] gastroenterology.  You should call them to schedule an appointment for follow-up for your diverticulitis. ? ?You will need to be on a clear liquid diet and gradually increase your foods until your symptoms are improving.  I prescribed you pain medication that can pick up from your pharmacy.  Please take this as prescribed.  Do not take prior to driving as it may make you drowsy.  I have also prescribed you an antibiotic that you can take as prescribed. ? ?If you are not able to control your symptoms at home, you develop worsening symptoms, inability to tolerate oral fluids, or develop other new or concerning symptoms, please return to the emergency department. ?

## 2022-02-28 NOTE — ED Notes (Signed)
Pt A&OX4 ambulatory at d/c with independent steady gait. Pt verbalized understanding of d/c instructions, prescriptions and follow up care. 

## 2022-02-28 NOTE — ED Triage Notes (Signed)
Patient reports to the ER for pelvic pain. Reports she has no concern for STI's as she was recently tested. Reports pain and pressure with walking, denies pain with urination.  ?

## 2022-03-03 ENCOUNTER — Encounter: Payer: Medicaid Other | Admitting: Obstetrics and Gynecology

## 2022-04-14 NOTE — Congregational Nurse Program (Signed)
Texted information to client for food banks.  Vinnie Langton, RN  Congregational Nurse  [phone]

## 2022-04-14 NOTE — Congregational Nurse Program (Signed)
Attended heat stroke talk, resources workshop, interested in food banks.  Vinnie Langton, RN
